# Patient Record
Sex: Female | Born: 1970 | Race: White | Hispanic: No | Marital: Married | Smoking: Never smoker
Health system: Southern US, Community
[De-identification: ages and names within clinical notes are randomized; demographics above are authoritative.]

## PROBLEM LIST (undated history)

## (undated) DIAGNOSIS — K219 Gastro-esophageal reflux disease without esophagitis: Secondary | ICD-10-CM

## (undated) DIAGNOSIS — M722 Plantar fascial fibromatosis: Secondary | ICD-10-CM

## (undated) HISTORY — DX: Plantar fascial fibromatosis: M72.2

## (undated) HISTORY — DX: Gastro-esophageal reflux disease without esophagitis: K21.9

---

## 2012-11-14 LAB — HM PAP SMEAR: HM Pap smear: NEGATIVE

## 2015-01-05 ENCOUNTER — Encounter: Payer: Self-pay | Admitting: *Deleted

## 2015-01-09 ENCOUNTER — Encounter: Payer: Self-pay | Admitting: Obstetrics and Gynecology

## 2015-01-27 ENCOUNTER — Ambulatory Visit
Admission: RE | Admit: 2015-01-27 | Discharge: 2015-01-27 | Disposition: A | Discharge status: Home / Self Care | Payer: BLUE CROSS/BLUE SHIELD | Source: Ambulatory Visit | Attending: Obstetrics and Gynecology | Admitting: Obstetrics and Gynecology

## 2015-01-27 ENCOUNTER — Other Ambulatory Visit: Payer: Self-pay | Admitting: Obstetrics and Gynecology

## 2015-01-27 ENCOUNTER — Ambulatory Visit (INDEPENDENT_AMBULATORY_CARE_PROVIDER_SITE_OTHER): Payer: BLUE CROSS/BLUE SHIELD | Admitting: Obstetrics and Gynecology

## 2015-01-27 ENCOUNTER — Encounter: Payer: Self-pay | Admitting: Obstetrics and Gynecology

## 2015-01-27 VITALS — BP 112/68 | HR 73 | Ht 62.0 in | Wt 156.7 lb

## 2015-01-27 DIAGNOSIS — Z01419 Encounter for gynecological examination (general) (routine) without abnormal findings: Secondary | ICD-10-CM

## 2015-01-27 DIAGNOSIS — Z1231 Encounter for screening mammogram for malignant neoplasm of breast: Secondary | ICD-10-CM | POA: Insufficient documentation

## 2015-01-27 NOTE — Progress Notes (Signed)
  Subjective:     Rebekah Rivera is a 44 y.o. female and is here for a comprehensive physical exam. The patient reports increased migraines with menses- relieved with excedrine Migraine.  Social History   Social History  . Marital Status: Married    Spouse Name: N/A  . Number of Children: N/A  . Years of Education: N/A   Occupational History  . Not on file.   Social History Main Topics  . Smoking status: Never Smoker   . Smokeless tobacco: Never Used  . Alcohol Use: No  . Drug Use: No  . Sexual Activity: Yes   Other Topics Concern  . Not on file   Social History Narrative   Health Maintenance  Topic Date Due  . HIV Screening  06/02/1983  . TETANUS/TDAP  06/02/1987  . INFLUENZA VACCINE  12/29/2014    The following portions of the patient's history were reviewed and updated as appropriate: allergies, current medications, past family history, past medical history, past social history, past surgical history and problem list.  Review of Systems A comprehensive review of systems was negative.   Objective:    General appearance: alert, cooperative and appears stated age Neck: no adenopathy, no carotid bruit, no JVD, supple, symmetrical, trachea midline and thyroid not enlarged, symmetric, no tenderness/mass/nodules Lungs: clear to auscultation bilaterally Breasts: normal appearance, no masses or tenderness Heart: regular rate and rhythm, S1, S2 normal, no murmur, click, rub or gallop Abdomen: soft, non-tender; bowel sounds normal; no masses,  no organomegaly Pelvic: cervix normal in appearance, external genitalia normal, no adnexal masses or tenderness, no cervical motion tenderness, rectovaginal septum normal, uterus normal size, shape, and consistency and vagina normal without discharge    Assessment:    Healthy female exam. Menstrual migraines; increased breast density; overweight      Plan:  Pap obtained, MMG ordered. RTC 1 year or prn   See After Visit Summary for  Counseling Recommendations

## 2015-01-27 NOTE — Patient Instructions (Signed)
  Place annual gynecologic exam patient instructions here.  Thank you for enrolling in MyChart. Please follow the instructions below to securely access your online medical record. MyChart allows you to send messages to your doctor, view your test results, manage appointments, and more.   How Do I Sign Up? 1. In your Internet browser, go to [employer] and enter [email or web address]. 2. Click on the Sign Up Now link in the Sign In box. You will see the New Member Sign Up page. 3. Enter your MyChart Access Code exactly as it appears below. You will not need to use this code after you've completed the sign-up process. If you do not sign up before the expiration date, you must request a new code.  MyChart Access Code: [email or web address] Expires: 03/28/2015 10:23 AM  4. Enter your Social Security Number ([id number]) and Date of Birth (mm/dd/yyyy) as indicated and click Submit. You will be taken to the next sign-up page. 5. Create a MyChart ID. This will be your MyChart login ID and cannot be changed, so think of one that is secure and easy to remember. 6. Create a MyChart password. You can change your password at any time. 7. Enter your Password Reset Question and Answer. This can be used at a later time if you forget your password.  8. Enter your e-mail address. You will receive e-mail notification when new information is available in MyChart. 9. Click Sign Up. You can now view your medical record.   Additional Information Remember, MyChart is NOT to be used for urgent needs. For medical emergencies, dial 911.

## 2015-01-28 LAB — CYTOLOGY - PAP

## 2015-01-29 ENCOUNTER — Telehealth: Payer: Self-pay | Admitting: *Deleted

## 2015-01-29 ENCOUNTER — Encounter: Payer: Self-pay | Admitting: *Deleted

## 2015-01-29 ENCOUNTER — Encounter: Payer: Self-pay | Admitting: Obstetrics and Gynecology

## 2015-01-29 NOTE — Telephone Encounter (Signed)
Mailed letter about [employer]	[place]

## 2015-12-15 ENCOUNTER — Other Ambulatory Visit: Payer: Self-pay | Admitting: Obstetrics and Gynecology

## 2015-12-15 DIAGNOSIS — Z1231 Encounter for screening mammogram for malignant neoplasm of breast: Secondary | ICD-10-CM

## 2016-01-28 ENCOUNTER — Ambulatory Visit
Admission: RE | Admit: 2016-01-28 | Discharge: 2016-01-28 | Disposition: A | Discharge status: Home / Self Care | Payer: BLUE CROSS/BLUE SHIELD | Source: Ambulatory Visit | Attending: Obstetrics and Gynecology | Admitting: Obstetrics and Gynecology

## 2016-01-28 ENCOUNTER — Other Ambulatory Visit: Payer: Self-pay | Admitting: Obstetrics and Gynecology

## 2016-01-28 DIAGNOSIS — Z1231 Encounter for screening mammogram for malignant neoplasm of breast: Secondary | ICD-10-CM

## 2016-02-24 ENCOUNTER — Ambulatory Visit (INDEPENDENT_AMBULATORY_CARE_PROVIDER_SITE_OTHER): Payer: BLUE CROSS/BLUE SHIELD | Admitting: Obstetrics and Gynecology

## 2016-02-24 ENCOUNTER — Encounter: Payer: Self-pay | Admitting: Obstetrics and Gynecology

## 2016-02-24 VITALS — BP 128/76 | HR 67 | Ht 62.0 in | Wt 155.3 lb

## 2016-02-24 DIAGNOSIS — F3281 Premenstrual dysphoric disorder: Secondary | ICD-10-CM

## 2016-02-24 DIAGNOSIS — Z01419 Encounter for gynecological examination (general) (routine) without abnormal findings: Secondary | ICD-10-CM | POA: Diagnosis not present

## 2016-02-24 MED ORDER — FLUOXETINE HCL 10 MG PO CAPS: 10.0000 mg | ORAL_CAPSULE | Freq: Every day | ORAL | 3 refills | Status: DC

## 2016-02-24 NOTE — Progress Notes (Signed)
Subjective:   Percell Belteresa Lederman is a 45 y.o. G0P0000 Caucasian female here for a routine well-woman exam.  Patient's last menstrual period was 02/07/2016.    Current complaints: worsening PMS PCP: none       doesn't desire labs  Social History: Sexual: heterosexual Marital Status: married Living situation: with family Occupation: homemaker Tobacco/alcohol: no tobacco use Illicit drugs: no history of illicit drug use  The following portions of the patient's history were reviewed and updated as appropriate: allergies, current medications, past family history, past medical history, past social history, past surgical history and problem list.  Past Medical History Past Medical History:  Diagnosis Date  . GERD (gastroesophageal reflux disease)     Past Surgical History History reviewed. No pertinent surgical history.  Gynecologic History G0P0000  Patient's last menstrual period was 02/07/2016. Contraception: tubal ligation Last Pap: 2016. Results were: normal Last mammogram: 2017. Results were: normal  Obstetric History OB History  Gravida Para Term Preterm AB Living  0 0 0 0 0 0  SAB TAB Ectopic Multiple Live Births  0 0 0 0 0        Current Medications No current outpatient prescriptions on file prior to visit.   No current facility-administered medications on file prior to visit.     Review of Systems Patient denies any headaches, blurred vision, shortness of breath, chest pain, abdominal pain, problems with bowel movements, urination, or intercourse.  Objective:  BP 128/76   Pulse 67   Ht 5\' 2"  (1.575 m)   Wt 155 lb 4.8 oz (70.4 kg)   LMP 02/07/2016   BMI 28.40 kg/m  Physical Exam  General:  Well developed, well nourished, no acute distress. She is alert and oriented x3. Skin:  Warm and dry Neck:  Midline trachea, no thyromegaly or nodules Cardiovascular: Regular rate and rhythm, no murmur heard Lungs:  Effort normal, all lung fields clear to auscultation  bilaterally Breasts:  No dominant palpable mass, retraction, or nipple discharge Abdomen:  Soft, non tender, no hepatosplenomegaly or masses Pelvic:  External genitalia is normal in appearance.  The vagina is normal in appearance. The cervix is bulbous, no CMT.  Thin prep pap is not done . Uterus is felt to be normal size, shape, and contour.  No adnexal masses or tenderness noted.  Extremities:  No swelling or varicosities noted Psych:  She has a normal mood and affect  Assessment:   Healthy well-woman exam PMDD  Plan:  Started prozac as needed for PMS F/U 1 year for AE, or sooner if needed   Jasilyn Holderman Suzan NailerN Elizabeth Paulsen, CNM

## 2016-02-24 NOTE — Patient Instructions (Signed)
Preventive Care for Adults, Female A healthy lifestyle and preventive care can promote health and wellness. Preventive health guidelines for women include the following key practices.  A routine yearly physical is a good way to check with your health care provider about your health and preventive screening. It is a chance to share any concerns and updates on your health and to receive a thorough exam.  Visit your dentist for a routine exam and preventive care every 6 months. Brush your teeth twice a day and floss once a day. Good oral hygiene prevents tooth decay and gum disease.  The frequency of eye exams is based on your age, health, family medical history, use of contact lenses, and other factors. Follow your health care provider's recommendations for frequency of eye exams.  Eat a healthy diet. Foods like vegetables, fruits, whole grains, low-fat dairy products, and lean protein foods contain the nutrients you need without too many calories. Decrease your intake of foods high in solid fats, added sugars, and salt. Eat the right amount of calories for you.Get information about a proper diet from your health care provider, if necessary.  Regular physical exercise is one of the most important things you can do for your health. Most adults should get at least 150 minutes of moderate-intensity exercise (any activity that increases your heart rate and causes you to sweat) each week. In addition, most adults need muscle-strengthening exercises on 2 or more days a week.  Maintain a healthy weight. The body mass index (BMI) is a screening tool to identify possible weight problems. It provides an estimate of body fat based on height and weight. Your health care provider can find your BMI and can help you achieve or maintain a healthy weight.For adults 20 years and older:  A BMI below 18.5 is considered underweight.  A BMI of 18.5 to 24.9 is normal.  A BMI of 25 to 29.9 is considered  overweight.  A BMI of 30 and above is considered obese.  Maintain normal blood lipids and cholesterol levels by exercising and minimizing your intake of saturated fat. Eat a balanced diet with plenty of fruit and vegetables. Blood tests for lipids and cholesterol should begin at age 64 and be repeated every 5 years. If your lipid or cholesterol levels are high, you are over 50, or you are at high risk for heart disease, you may need your cholesterol levels checked more frequently.Ongoing high lipid and cholesterol levels should be treated with medicines if diet and exercise are not working.  If you smoke, find out from your health care provider how to quit. If you do not use tobacco, do not start.  Lung cancer screening is recommended for adults aged 52-80 years who are at high risk for developing lung cancer because of a history of smoking. A yearly low-dose CT scan of the lungs is recommended for people who have at least a 30-pack-year history of smoking and are a current smoker or have quit within the past 15 years. A pack year of smoking is smoking an average of 1 pack of cigarettes a day for 1 year (for example: 1 pack a day for 30 years or 2 packs a day for 15 years). Yearly screening should continue until the smoker has stopped smoking for at least 15 years. Yearly screening should be stopped for people who develop a health problem that would prevent them from having lung cancer treatment.  If you are pregnant, do not drink alcohol. If you are  breastfeeding, be very cautious about drinking alcohol. If you are not pregnant and choose to drink alcohol, do not have more than 1 drink per day. One drink is considered to be 12 ounces (355 mL) of beer, 5 ounces (148 mL) of wine, or 1.5 ounces (44 mL) of liquor.  Avoid use of street drugs. Do not share needles with anyone. Ask for help if you need support or instructions about stopping the use of drugs.  High blood pressure causes heart disease and  increases the risk of stroke. Your blood pressure should be checked at least every 1 to 2 years. Ongoing high blood pressure should be treated with medicines if weight loss and exercise do not work.  If you are 25-78 years old, ask your health care provider if you should take aspirin to prevent strokes.  Diabetes screening is done by taking a blood sample to check your blood glucose level after you have not eaten for a certain period of time (fasting). If you are not overweight and you do not have risk factors for diabetes, you should be screened once every 3 years starting at age 86. If you are overweight or obese and you are 3-87 years of age, you should be screened for diabetes every year as part of your cardiovascular risk assessment.  Breast cancer screening is essential preventive care for women. You should practice "breast self-awareness." This means understanding the normal appearance and feel of your breasts and may include breast self-examination. Any changes detected, no matter how small, should be reported to a health care provider. Women in their 66s and 30s should have a clinical breast exam (CBE) by a health care provider as part of a regular health exam every 1 to 3 years. After age 43, women should have a CBE every year. Starting at age 37, women should consider having a mammogram (breast X-ray test) every year. Women who have a family history of breast cancer should talk to their health care provider about genetic screening. Women at a high risk of breast cancer should talk to their health care providers about having an MRI and a mammogram every year.  Breast cancer gene (BRCA)-related cancer risk assessment is recommended for women who have family members with BRCA-related cancers. BRCA-related cancers include breast, ovarian, tubal, and peritoneal cancers. Having family members with these cancers may be associated with an increased risk for harmful changes (mutations) in the breast  cancer genes BRCA1 and BRCA2. Results of the assessment will determine the need for genetic counseling and BRCA1 and BRCA2 testing.  Your health care provider may recommend that you be screened regularly for cancer of the pelvic organs (ovaries, uterus, and vagina). This screening involves a pelvic examination, including checking for microscopic changes to the surface of your cervix (Pap test). You may be encouraged to have this screening done every 3 years, beginning at age 78.  For women ages 79-65, health care providers may recommend pelvic exams and Pap testing every 3 years, or they may recommend the Pap and pelvic exam, combined with testing for human papilloma virus (HPV), every 5 years. Some types of HPV increase your risk of cervical cancer. Testing for HPV may also be done on women of any age with unclear Pap test results.  Other health care providers may not recommend any screening for nonpregnant women who are considered low risk for pelvic cancer and who do not have symptoms. Ask your health care provider if a screening pelvic exam is right for  you.  If you have had past treatment for cervical cancer or a condition that could lead to cancer, you need Pap tests and screening for cancer for at least 20 years after your treatment. If Pap tests have been discontinued, your risk factors (such as having a new sexual partner) need to be reassessed to determine if screening should resume. Some women have medical problems that increase the chance of getting cervical cancer. In these cases, your health care provider may recommend more frequent screening and Pap tests.  Colorectal cancer can be detected and often prevented. Most routine colorectal cancer screening begins at the age of 50 years and continues through age 75 years. However, your health care provider may recommend screening at an earlier age if you have risk factors for colon cancer. On a yearly basis, your health care provider may provide  home test kits to check for hidden blood in the stool. Use of a small camera at the end of a tube, to directly examine the colon (sigmoidoscopy or colonoscopy), can detect the earliest forms of colorectal cancer. Talk to your health care provider about this at age 50, when routine screening begins. Direct exam of the colon should be repeated every 5-10 years through age 75 years, unless early forms of precancerous polyps or small growths are found.  People who are at an increased risk for hepatitis B should be screened for this virus. You are considered at high risk for hepatitis B if:  You were born in a country where hepatitis B occurs often. Talk with your health care provider about which countries are considered high risk.  Your parents were born in a high-risk country and you have not received a shot to protect against hepatitis B (hepatitis B vaccine).  You have HIV or AIDS.  You use needles to inject street drugs.  You live with, or have sex with, someone who has hepatitis B.  You get hemodialysis treatment.  You take certain medicines for conditions like cancer, organ transplantation, and autoimmune conditions.  Hepatitis C blood testing is recommended for all people born from 1945 through 1965 and any individual with known risks for hepatitis C.  Practice safe sex. Use condoms and avoid high-risk sexual practices to reduce the spread of sexually transmitted infections (STIs). STIs include gonorrhea, chlamydia, syphilis, trichomonas, herpes, HPV, and human immunodeficiency virus (HIV). Herpes, HIV, and HPV are viral illnesses that have no cure. They can result in disability, cancer, and death.  You should be screened for sexually transmitted illnesses (STIs) including gonorrhea and chlamydia if:  You are sexually active and are younger than 24 years.  You are older than 24 years and your health care provider tells you that you are at risk for this type of infection.  Your sexual  activity has changed since you were last screened and you are at an increased risk for chlamydia or gonorrhea. Ask your health care provider if you are at risk.  If you are at risk of being infected with HIV, it is recommended that you take a prescription medicine daily to prevent HIV infection. This is called preexposure prophylaxis (PrEP). You are considered at risk if:  You are sexually active and do not regularly use condoms or know the HIV status of your partner(s).  You take drugs by injection.  You are sexually active with a partner who has HIV.  Talk with your health care provider about whether you are at high risk of being infected with HIV. If   you choose to begin PrEP, you should first be tested for HIV. You should then be tested every 3 months for as long as you are taking PrEP.  Osteoporosis is a disease in which the bones lose minerals and strength with aging. This can result in serious bone fractures or breaks. The risk of osteoporosis can be identified using a bone density scan. Women ages 1 years and over and women at risk for fractures or osteoporosis should discuss screening with their health care providers. Ask your health care provider whether you should take a calcium supplement or vitamin D to reduce the rate of osteoporosis.  Menopause can be associated with physical symptoms and risks. Hormone replacement therapy is available to decrease symptoms and risks. You should talk to your health care provider about whether hormone replacement therapy is right for you.  Use sunscreen. Apply sunscreen liberally and repeatedly throughout the day. You should seek shade when your shadow is shorter than you. Protect yourself by wearing long sleeves, pants, a wide-brimmed hat, and sunglasses year round, whenever you are outdoors.  Once a month, do a whole body skin exam, using a mirror to look at the skin on your back. Tell your health care provider of new moles, moles that have irregular  borders, moles that are larger than a pencil eraser, or moles that have changed in shape or color.  Stay current with required vaccines (immunizations).  Influenza vaccine. All adults should be immunized every year.  Tetanus, diphtheria, and acellular pertussis (Td, Tdap) vaccine. Pregnant women should receive 1 dose of Tdap vaccine during each pregnancy. The dose should be obtained regardless of the length of time since the last dose. Immunization is preferred during the 27th-36th week of gestation. An adult who has not previously received Tdap or who does not know her vaccine status should receive 1 dose of Tdap. This initial dose should be followed by tetanus and diphtheria toxoids (Td) booster doses every 10 years. Adults with an unknown or incomplete history of completing a 3-dose immunization series with Td-containing vaccines should begin or complete a primary immunization series including a Tdap dose. Adults should receive a Td booster every 10 years.  Varicella vaccine. An adult without evidence of immunity to varicella should receive 2 doses or a second dose if she has previously received 1 dose. Pregnant females who do not have evidence of immunity should receive the first dose after pregnancy. This first dose should be obtained before leaving the health care facility. The second dose should be obtained 4-8 weeks after the first dose.  Human papillomavirus (HPV) vaccine. Females aged 13-26 years who have not received the vaccine previously should obtain the 3-dose series. The vaccine is not recommended for use in pregnant females. However, pregnancy testing is not needed before receiving a dose. If a female is found to be pregnant after receiving a dose, no treatment is needed. In that case, the remaining doses should be delayed until after the pregnancy. Immunization is recommended for any person with an immunocompromised condition through the age of 24 years if she did not get any or all doses  earlier. During the 3-dose series, the second dose should be obtained 4-8 weeks after the first dose. The third dose should be obtained 24 weeks after the first dose and 16 weeks after the second dose.  Zoster vaccine. One dose is recommended for adults aged 97 years or older unless certain conditions are present.  Measles, mumps, and rubella (MMR) vaccine. Adults born  before 1957 generally are considered immune to measles and mumps. Adults born in [birth year] or later should have 1 or more doses of MMR vaccine unless there is a contraindication to the vaccine or there is laboratory evidence of immunity to each of the three diseases. A routine second dose of MMR vaccine should be obtained at least 28 days after the first dose for students attending postsecondary schools, health care workers, or international travelers. People who received inactivated measles vaccine or an unknown type of measles vaccine during 1963-1967 should receive 2 doses of MMR vaccine. People who received inactivated mumps vaccine or an unknown type of mumps vaccine before 1979 and are at high risk for mumps infection should consider immunization with 2 doses of MMR vaccine. For females of childbearing age, rubella immunity should be determined. If there is no evidence of immunity, females who are not pregnant should be vaccinated. If there is no evidence of immunity, females who are pregnant should delay immunization until after pregnancy. Unvaccinated health care workers born before 60 who lack laboratory evidence of measles, mumps, or rubella immunity or laboratory confirmation of disease should consider measles and mumps immunization with 2 doses of MMR vaccine or rubella immunization with 1 dose of MMR vaccine.  Pneumococcal 13-valent conjugate (PCV13) vaccine. When indicated, a person who is uncertain of his immunization history and has no record of immunization should receive the PCV13 vaccine. All adults 61 years of age and older  should receive this vaccine. An adult aged 92 years or older who has certain medical conditions and has not been previously immunized should receive 1 dose of PCV13 vaccine. This PCV13 should be followed with a dose of pneumococcal polysaccharide (PPSV23) vaccine. Adults who are at high risk for pneumococcal disease should obtain the PPSV23 vaccine at least 8 weeks after the dose of PCV13 vaccine. Adults older than 47 years of age who have normal immune system function should obtain the PPSV23 vaccine dose at least 1 year after the dose of PCV13 vaccine.  Pneumococcal polysaccharide (PPSV23) vaccine. When PCV13 is also indicated, PCV13 should be obtained first. All adults aged 2 years and older should be immunized. An adult younger than age 30 years who has certain medical conditions should be immunized. Any person who resides in a nursing home or long-term care facility should be immunized. An adult smoker should be immunized. People with an immunocompromised condition and certain other conditions should receive both PCV13 and PPSV23 vaccines. People with human immunodeficiency virus (HIV) infection should be immunized as soon as possible after diagnosis. Immunization during chemotherapy or radiation therapy should be avoided. Routine use of PPSV23 vaccine is not recommended for American Indians, [place] Natives, or people younger than 65 years unless there are medical conditions that require PPSV23 vaccine. When indicated, people who have unknown immunization and have no record of immunization should receive PPSV23 vaccine. One-time revaccination 5 years after the first dose of PPSV23 is recommended for people aged 19-64 years who have chronic kidney failure, nephrotic syndrome, asplenia, or immunocompromised conditions. People who received 1-2 doses of PPSV23 before age 44 years should receive another dose of PPSV23 vaccine at age 83 years or later if at least 5 years have passed since the previous dose. Doses  of PPSV23 are not needed for people immunized with PPSV23 at or after age 20 years.  Meningococcal vaccine. Adults with asplenia or persistent complement component deficiencies should receive 2 doses of quadrivalent meningococcal conjugate (MenACWY-D) vaccine. The doses should be obtained  at least 2 months apart. Microbiologists working with certain meningococcal bacteria, [place] recruits, people at risk during an outbreak, and people who travel to or live in countries with a high rate of meningitis should be immunized. A first-year college student up through age 28 years who is living in a residence hall should receive a dose if she did not receive a dose on or after her 16th birthday. Adults who have certain high-risk conditions should receive one or more doses of vaccine.  Hepatitis A vaccine. Adults who wish to be protected from this disease, have certain high-risk conditions, work with hepatitis A-infected animals, work in hepatitis A research labs, or travel to or work in countries with a high rate of hepatitis A should be immunized. Adults who were previously unvaccinated and who anticipate close contact with an international adoptee during the first 60 days after arrival in the [place] from a country with a high rate of hepatitis A should be immunized.  Hepatitis B vaccine. Adults who wish to be protected from this disease, have certain high-risk conditions, may be exposed to blood or other infectious body fluids, are household contacts or sex partners of hepatitis B positive people, are clients or workers in certain care facilities, or travel to or work in countries with a high rate of hepatitis B should be immunized.  Haemophilus influenzae type b (Hib) vaccine. A previously unvaccinated person with asplenia or sickle cell disease or having a scheduled splenectomy should receive 1 dose of Hib vaccine. Regardless of previous immunization, a recipient of a hematopoietic stem cell transplant  should receive a 3-dose series 6-12 months after her successful transplant. Hib vaccine is not recommended for adults with HIV infection. Preventive Services / Frequency Ages 71 to 87 years  Blood pressure check.** / Every 3-5 years.  Lipid and cholesterol check.** / Every 5 years beginning at age 1.  Clinical breast exam.** / Every 3 years for women in their 3s and 31s.  BRCA-related cancer risk assessment.** / For women who have family members with a BRCA-related cancer (breast, ovarian, tubal, or peritoneal cancers).  Pap test.** / Every 2 years from ages 50 through 86. Every 3 years starting at age 87 through age 7 or 75 with a history of 3 consecutive normal Pap tests.  HPV screening.** / Every 3 years from ages 59 through ages 35 to 6 with a history of 3 consecutive normal Pap tests.  Hepatitis C blood test.** / For any individual with known risks for hepatitis C.  Skin self-exam. / Monthly.  Influenza vaccine. / Every year.  Tetanus, diphtheria, and acellular pertussis (Tdap, Td) vaccine.** / Consult your health care provider. Pregnant women should receive 1 dose of Tdap vaccine during each pregnancy. 1 dose of Td every 10 years.  Varicella vaccine.** / Consult your health care provider. Pregnant females who do not have evidence of immunity should receive the first dose after pregnancy.  HPV vaccine. / 3 doses over 6 months, if 72 and younger. The vaccine is not recommended for use in pregnant females. However, pregnancy testing is not needed before receiving a dose.  Measles, mumps, rubella (MMR) vaccine.** / You need at least 1 dose of MMR if you were born in 1957 or later. You may also need a 2nd dose. For females of childbearing age, rubella immunity should be determined. If there is no evidence of immunity, females who are not pregnant should be vaccinated. If there is no evidence of immunity, females who are  pregnant should delay immunization until after  pregnancy.  Pneumococcal 13-valent conjugate (PCV13) vaccine.** / Consult your health care provider.  Pneumococcal polysaccharide (PPSV23) vaccine.** / 1 to 2 doses if you smoke cigarettes or if you have certain conditions.  Meningococcal vaccine.** / 1 dose if you are age 87 to 44 years and a Market researcher living in a residence hall, or have one of several medical conditions, you need to get vaccinated against meningococcal disease. You may also need additional booster doses.  Hepatitis A vaccine.** / Consult your health care provider.  Hepatitis B vaccine.** / Consult your health care provider.  Haemophilus influenzae type b (Hib) vaccine.** / Consult your health care provider. Ages 86 to 38 years  Blood pressure check.** / Every year.  Lipid and cholesterol check.** / Every 5 years beginning at age 49 years.  Lung cancer screening. / Every year if you are aged 71-80 years and have a 30-pack-year history of smoking and currently smoke or have quit within the past 15 years. Yearly screening is stopped once you have quit smoking for at least 15 years or develop a health problem that would prevent you from having lung cancer treatment.  Clinical breast exam.** / Every year after age 51 years.  BRCA-related cancer risk assessment.** / For women who have family members with a BRCA-related cancer (breast, ovarian, tubal, or peritoneal cancers).  Mammogram.** / Every year beginning at age 18 years and continuing for as long as you are in good health. Consult with your health care provider.  Pap test.** / Every 3 years starting at age 63 years through age 37 or 57 years with a history of 3 consecutive normal Pap tests.  HPV screening.** / Every 3 years from ages 41 years through ages 76 to 23 years with a history of 3 consecutive normal Pap tests.  Fecal occult blood test (FOBT) of stool. / Every year beginning at age 36 years and continuing until age 51 years. You may not need  to do this test if you get a colonoscopy every 10 years.  Flexible sigmoidoscopy or colonoscopy.** / Every 5 years for a flexible sigmoidoscopy or every 10 years for a colonoscopy beginning at age 36 years and continuing until age 35 years.  Hepatitis C blood test.** / For all people born from [birth year] through 1965 and any individual with known risks for hepatitis C.  Skin self-exam. / Monthly.  Influenza vaccine. / Every year.  Tetanus, diphtheria, and acellular pertussis (Tdap/Td) vaccine.** / Consult your health care provider. Pregnant women should receive 1 dose of Tdap vaccine during each pregnancy. 1 dose of Td every 10 years.  Varicella vaccine.** / Consult your health care provider. Pregnant females who do not have evidence of immunity should receive the first dose after pregnancy.  Zoster vaccine.** / 1 dose for adults aged 73 years or older.  Measles, mumps, rubella (MMR) vaccine.** / You need at least 1 dose of MMR if you were born in 1957 or later. You may also need a second dose. For females of childbearing age, rubella immunity should be determined. If there is no evidence of immunity, females who are not pregnant should be vaccinated. If there is no evidence of immunity, females who are pregnant should delay immunization until after pregnancy.  Pneumococcal 13-valent conjugate (PCV13) vaccine.** / Consult your health care provider.  Pneumococcal polysaccharide (PPSV23) vaccine.** / 1 to 2 doses if you smoke cigarettes or if you have certain conditions.  Meningococcal vaccine.** /  Consult your health care provider.  Hepatitis A vaccine.** / Consult your health care provider.  Hepatitis B vaccine.** / Consult your health care provider.  Haemophilus influenzae type b (Hib) vaccine.** / Consult your health care provider. Ages 80 years and over  Blood pressure check.** / Every year.  Lipid and cholesterol check.** / Every 5 years beginning at age 62 years.  Lung cancer  screening. / Every year if you are aged 32-80 years and have a 30-pack-year history of smoking and currently smoke or have quit within the past 15 years. Yearly screening is stopped once you have quit smoking for at least 15 years or develop a health problem that would prevent you from having lung cancer treatment.  Clinical breast exam.** / Every year after age 61 years.  BRCA-related cancer risk assessment.** / For women who have family members with a BRCA-related cancer (breast, ovarian, tubal, or peritoneal cancers).  Mammogram.** / Every year beginning at age 39 years and continuing for as long as you are in good health. Consult with your health care provider.  Pap test.** / Every 3 years starting at age 85 years through age 74 or 72 years with 3 consecutive normal Pap tests. Testing can be stopped between 65 and 70 years with 3 consecutive normal Pap tests and no abnormal Pap or HPV tests in the past 10 years.  HPV screening.** / Every 3 years from ages 55 years through ages 67 or 77 years with a history of 3 consecutive normal Pap tests. Testing can be stopped between 65 and 70 years with 3 consecutive normal Pap tests and no abnormal Pap or HPV tests in the past 10 years.  Fecal occult blood test (FOBT) of stool. / Every year beginning at age 81 years and continuing until age 22 years. You may not need to do this test if you get a colonoscopy every 10 years.  Flexible sigmoidoscopy or colonoscopy.** / Every 5 years for a flexible sigmoidoscopy or every 10 years for a colonoscopy beginning at age 67 years and continuing until age 22 years.  Hepatitis C blood test.** / For all people born from [birth year] through 1967 and any individual with known risks for hepatitis C.  Osteoporosis screening.** / A one-time screening for women ages 8 years and over and women at risk for fractures or osteoporosis.  Skin self-exam. / Monthly.  Influenza vaccine. / Every year.  Tetanus, diphtheria, and  acellular pertussis (Tdap/Td) vaccine.** / 1 dose of Td every 10 years.  Varicella vaccine.** / Consult your health care provider.  Zoster vaccine.** / 1 dose for adults aged 56 years or older.  Pneumococcal 13-valent conjugate (PCV13) vaccine.** / Consult your health care provider.  Pneumococcal polysaccharide (PPSV23) vaccine.** / 1 dose for all adults aged 15 years and older.  Meningococcal vaccine.** / Consult your health care provider.  Hepatitis A vaccine.** / Consult your health care provider.  Hepatitis B vaccine.** / Consult your health care provider.  Haemophilus influenzae type b (Hib) vaccine.** / Consult your health care provider. ** Family history and personal history of risk and conditions may change your health care provider's recommendations.   This information is not intended to replace advice given to you by your health care provider. Make sure you discuss any questions you have with your health care provider.   Document Released: 07/12/2001 Document Revised: 06/06/2014 Document Reviewed: 10/11/2010 Elsevier Interactive Patient Education [employer].

## 2017-02-23 ENCOUNTER — Ambulatory Visit: Payer: BLUE CROSS/BLUE SHIELD

## 2017-02-23 DIAGNOSIS — M722 Plantar fascial fibromatosis: Secondary | ICD-10-CM

## 2017-02-24 ENCOUNTER — Ambulatory Visit (INDEPENDENT_AMBULATORY_CARE_PROVIDER_SITE_OTHER): Payer: BLUE CROSS/BLUE SHIELD

## 2017-02-24 ENCOUNTER — Ambulatory Visit (INDEPENDENT_AMBULATORY_CARE_PROVIDER_SITE_OTHER): Payer: BLUE CROSS/BLUE SHIELD | Admitting: Podiatry

## 2017-02-24 ENCOUNTER — Encounter: Payer: Self-pay | Admitting: Podiatry

## 2017-02-24 VITALS — BP 104/62 | HR 62

## 2017-02-24 DIAGNOSIS — M722 Plantar fascial fibromatosis: Secondary | ICD-10-CM

## 2017-02-24 MED ORDER — DICLOFENAC SODIUM 75 MG PO TBEC
75.0000 mg | DELAYED_RELEASE_TABLET | Freq: Two times a day (BID) | ORAL | 0 refills | Status: DC
Start: 2017-02-24 — End: 2017-03-24

## 2017-02-24 MED ORDER — BETAMETHASONE SOD PHOS & ACET 6 (3-3) MG/ML IJ SUSP: 3.0000 mg | Freq: Once | INTRAMUSCULAR | Status: AC

## 2017-02-24 NOTE — Progress Notes (Signed)
   Subjective: Patient presents today for pain and tenderness in the right foot. Patient states the foot pain has been hurting for a couple of years now. Patient states that it hurts in the mornings with the first steps out of bed. Patient denies any significant trauma. She has tried over-the-counter orthotics, stretching, icing. Patient presents today for further treatment and evaluation.  Past Medical History:  Diagnosis Date  . GERD (gastroesophageal reflux disease)      Objective: Physical Exam General: The patient is alert and oriented x3 in no acute distress.  Dermatology: Skin is warm, dry and supple bilateral lower extremities. Negative for open lesions or macerations bilateral.   Vascular: Dorsalis Pedis and Posterior Tibial pulses palpable bilateral.  Capillary fill time is immediate to all digits.  Neurological: Epicritic and protective threshold intact bilateral.   Musculoskeletal: Tenderness to palpation at the medial calcaneal tubercale and through the insertion of the plantar fascia of the right foot. All other joints range of motion within normal limits bilateral. Strength 5/5 in all groups bilateral.   Radiographic exam: Normal osseous mineralization. Joint spaces preserved. No fracture/dislocation/boney destruction. Calcaneal spur present with mild thickening of plantar fascia right. No other soft tissue abnormalities or radiopaque foreign bodies.   Assessment: 1. Plantar fasciitis right 2. Pain in right foot  Plan of Care:  1. Patient evaluated. Xrays reviewed.   2. Injection of 0.5cc Celestone soluspan injected into the right plantar fascia  3. Rx for Diclofenac  PO BID ordered for patient. 4. Plantar fascial band(s) dispensed 5. Instructed patient regarding therapies and modalities at home to alleviate symptoms.  6. Return to clinic in 4 weeks.     Felecia Shelling, DPM [facility]  Dr. Felecia Shelling, DPM    [address] [place], [place] [address]                Office [phone]  Fax [phone]

## 2017-03-24 ENCOUNTER — Other Ambulatory Visit: Payer: Self-pay

## 2017-03-24 ENCOUNTER — Encounter: Payer: Self-pay | Admitting: Podiatry

## 2017-03-24 ENCOUNTER — Ambulatory Visit (INDEPENDENT_AMBULATORY_CARE_PROVIDER_SITE_OTHER): Payer: BLUE CROSS/BLUE SHIELD | Admitting: Podiatry

## 2017-03-24 DIAGNOSIS — M722 Plantar fascial fibromatosis: Secondary | ICD-10-CM | POA: Diagnosis not present

## 2017-03-24 MED ORDER — DICLOFENAC SODIUM 75 MG PO TBEC: 75.0000 mg | DELAYED_RELEASE_TABLET | Freq: Two times a day (BID) | ORAL | 1 refills | Status: DC

## 2017-03-26 NOTE — Progress Notes (Signed)
   Subjective: Patient presents today for follow-up evaluation of right plantar fasciitis. She states the pain has improved but she is still experiencing some soreness of the foot. She reports associated tightness to the posterior heel. She reports some relief after the injection but it only lasted for a few days. Patient presents today for further treatment and evaluation.   Past Medical History:  Diagnosis Date  . GERD (gastroesophageal reflux disease)      Objective: Physical Exam General: The patient is alert and oriented x3 in no acute distress.  Dermatology: Skin is warm, dry and supple bilateral lower extremities. Negative for open lesions or macerations bilateral.   Vascular: Dorsalis Pedis and Posterior Tibial pulses palpable bilateral.  Capillary fill time is immediate to all digits.  Neurological: Epicritic and protective threshold intact bilateral.   Musculoskeletal: Tenderness to palpation at the medial calcaneal tubercale and through the insertion of the plantar fascia of the right foot. All other joints range of motion within normal limits bilateral. Strength 5/5 in all groups bilateral.    Assessment: 1. Plantar fasciitis right 2. Pain in right foot  Plan of Care:  1. Patient evaluated.  2. Injection of 0.5cc Celestone soluspan injected into the right plantar fascia  3. Refill prescription for diclofenac given to patient. 4. Continue wearing plantar fascial brace. 5. Return to clinic in 4 weeks. If not better, appt with Raiford Nobleick for custom molded orthotics.   Felecia ShellingBrent M. Xaviera Flaten, DPM [facility]  Dr. Felecia ShellingBrent M. Rebeca Valdivia, DPM    [address]                                        [place], [place] [address]                Office [phone]  Fax [phone]

## 2017-03-29 MED ORDER — BETAMETHASONE SOD PHOS & ACET 6 (3-3) MG/ML IJ SUSP: 3.0000 mg | Freq: Once | INTRAMUSCULAR | Status: AC

## 2017-04-04 ENCOUNTER — Other Ambulatory Visit: Payer: Self-pay | Admitting: Obstetrics and Gynecology

## 2017-04-04 DIAGNOSIS — Z1231 Encounter for screening mammogram for malignant neoplasm of breast: Secondary | ICD-10-CM

## 2017-04-05 ENCOUNTER — Ambulatory Visit (INDEPENDENT_AMBULATORY_CARE_PROVIDER_SITE_OTHER): Payer: BLUE CROSS/BLUE SHIELD | Admitting: Obstetrics and Gynecology

## 2017-04-05 ENCOUNTER — Encounter: Payer: Self-pay | Admitting: Obstetrics and Gynecology

## 2017-04-05 VITALS — BP 122/70 | HR 56 | Ht 62.0 in | Wt 159.9 lb

## 2017-04-05 DIAGNOSIS — Z01419 Encounter for gynecological examination (general) (routine) without abnormal findings: Secondary | ICD-10-CM | POA: Diagnosis not present

## 2017-04-05 DIAGNOSIS — Z23 Encounter for immunization: Secondary | ICD-10-CM

## 2017-04-05 DIAGNOSIS — Z8262 Family history of osteoporosis: Secondary | ICD-10-CM | POA: Diagnosis not present

## 2017-04-05 NOTE — Progress Notes (Signed)
Subjective:   Percell Belteresa Mcglinchey is a 46 y.o. G0P0000 Caucasian female here for a routine well-woman exam.  Patient's last menstrual period was 03/30/2017.    Current complaints: none PCP: ?       does desire labs  Social History: Sexual: heterosexual Marital Status: married Living situation: with spouse Occupation: homemaker Tobacco/alcohol: no tobacco use Illicit drugs: no history of illicit drug use  The following portions of the patient's history were reviewed and updated as appropriate: allergies, current medications, past family history, past medical history, past social history, past surgical history and problem list.  Past Medical History Past Medical History:  Diagnosis Date  . GERD (gastroesophageal reflux disease)     Past Surgical History No past surgical history on file.  Gynecologic History G0P0000  Patient's last menstrual period was 03/30/2017. Contraception: tubal ligation Last Pap: 2016. Results were: normal Last mammogram: 2017. Results were: normal   Obstetric History OB History  Gravida Para Term Preterm AB Living  0 0 0 0 0 0  SAB TAB Ectopic Multiple Live Births  0 0 0 0 0        Current Medications Current Outpatient Medications on File Prior to Visit  Medication Sig Dispense Refill  . diclofenac (VOLTAREN) 75 MG EC tablet Take 1 tablet (75 mg total) by mouth 2 (two) times daily. (Patient not taking: Reported on 04/05/2017) 60 tablet 1   Current Facility-Administered Medications on File Prior to Visit  Medication Dose Route Frequency Provider Last Rate Last Dose  . betamethasone acetate-betamethasone sodium phosphate (CELESTONE) injection 3 mg  3 mg Intramuscular Once Gala LewandowskyEvans, Brent M, DPM      . betamethasone acetate-betamethasone sodium phosphate (CELESTONE) injection 3 mg  3 mg Intramuscular Once Felecia ShellingEvans, Brent M, DPM        Review of Systems Patient denies any headaches, blurred vision, shortness of breath, chest pain, abdominal pain, problems  with bowel movements, urination, or intercourse.  Objective:  BP 122/70   Pulse (!) 56   Ht 5\' 2"  (1.575 m)   Wt 159 lb 14.4 oz (72.5 kg)   LMP 03/30/2017   BMI 29.25 kg/m  Physical Exam  General:  Well developed, well nourished, no acute distress. She is alert and oriented x3. Skin:  Warm and dry Neck:  Midline trachea, no thyromegaly or nodules Cardiovascular: Regular rate and rhythm, no murmur heard Lungs:  Effort normal, all lung fields clear to auscultation bilaterally Breasts:  No dominant palpable mass, retraction, or nipple discharge Abdomen:  Soft, non tender, no hepatosplenomegaly or masses Pelvic:  External genitalia is normal in appearance.  The vagina is normal in appearance. The cervix is bulbous, no CMT.  Thin prep pap is not done . Uterus is felt to be normal size, shape, and contour.  No adnexal masses or tenderness noted. Extremities:  No swelling or varicosities noted Psych:  She has a normal mood and affect  Assessment:   Healthy well-woman exam Needs flu vaccine Family history of osteoporosis  Plan:  Flu vaccine given Labs obtained- will follow up accordingly F/U 1 year for AE, or sooner if needed Mammogram ordered  Idora Brosious Suzan NailerN Wallace Gappa, CNM

## 2017-04-06 LAB — COMPREHENSIVE METABOLIC PANEL
A/G RATIO: 2 (ref 1.2–2.2)
ALT: 14 IU/L (ref 0–32)
AST: 15 IU/L (ref 0–40)
Albumin: 4.4 g/dL (ref 3.5–5.5)
Alkaline Phosphatase: 87 IU/L (ref 39–117)
BUN/Creatinine Ratio: 11 (ref 9–23)
BUN: 8 mg/dL (ref 6–24)
Bilirubin Total: 0.3 mg/dL (ref 0.0–1.2)
CALCIUM: 9.4 mg/dL (ref 8.7–10.2)
CO2: 26 mmol/L (ref 20–29)
CREATININE: 0.74 mg/dL (ref 0.57–1.00)
Chloride: 106 mmol/L (ref 96–106)
GFR calc Af Amer: 111 mL/min/{1.73_m2} (ref >59)
GFR, EST NON AFRICAN AMERICAN: 96 mL/min/{1.73_m2} (ref >59)
Globulin, Total: 2.2 g/dL (ref 1.5–4.5)
Glucose: 90 mg/dL (ref 65–99)
POTASSIUM: 4.8 mmol/L (ref 3.5–5.2)
Sodium: 142 mmol/L (ref 134–144)
TOTAL PROTEIN: 6.6 g/dL (ref 6.0–8.5)

## 2017-04-06 LAB — VITAMIN D 25 HYDROXY (VIT D DEFICIENCY, FRACTURES): VIT D 25 HYDROXY: 36.9 ng/mL (ref 30.0–100.0)

## 2017-04-07 ENCOUNTER — Ambulatory Visit
Admission: RE | Admit: 2017-04-07 | Discharge: 2017-04-07 | Disposition: A | Discharge status: Home / Self Care | Payer: BLUE CROSS/BLUE SHIELD | Source: Ambulatory Visit | Attending: Obstetrics and Gynecology | Admitting: Obstetrics and Gynecology

## 2017-04-07 DIAGNOSIS — Z1231 Encounter for screening mammogram for malignant neoplasm of breast: Secondary | ICD-10-CM | POA: Diagnosis not present

## 2017-04-10 ENCOUNTER — Other Ambulatory Visit: Payer: Self-pay | Admitting: Obstetrics and Gynecology

## 2017-04-10 DIAGNOSIS — N6310 Unspecified lump in the right breast, unspecified quadrant: Secondary | ICD-10-CM

## 2017-04-10 DIAGNOSIS — R928 Other abnormal and inconclusive findings on diagnostic imaging of breast: Secondary | ICD-10-CM

## 2017-04-25 ENCOUNTER — Ambulatory Visit (INDEPENDENT_AMBULATORY_CARE_PROVIDER_SITE_OTHER): Payer: BLUE CROSS/BLUE SHIELD | Admitting: Podiatry

## 2017-04-25 ENCOUNTER — Encounter: Payer: Self-pay | Admitting: Podiatry

## 2017-04-25 DIAGNOSIS — M722 Plantar fascial fibromatosis: Secondary | ICD-10-CM

## 2017-04-26 ENCOUNTER — Ambulatory Visit
Admission: RE | Admit: 2017-04-26 | Discharge: 2017-04-26 | Disposition: A | Discharge status: Home / Self Care | Payer: BLUE CROSS/BLUE SHIELD | Source: Ambulatory Visit | Attending: Obstetrics and Gynecology | Admitting: Obstetrics and Gynecology

## 2017-04-26 DIAGNOSIS — R928 Other abnormal and inconclusive findings on diagnostic imaging of breast: Secondary | ICD-10-CM

## 2017-04-26 DIAGNOSIS — N6313 Unspecified lump in the right breast, lower outer quadrant: Secondary | ICD-10-CM | POA: Insufficient documentation

## 2017-04-26 DIAGNOSIS — N6310 Unspecified lump in the right breast, unspecified quadrant: Secondary | ICD-10-CM

## 2017-04-26 DIAGNOSIS — N6311 Unspecified lump in the right breast, upper outer quadrant: Secondary | ICD-10-CM | POA: Insufficient documentation

## 2017-04-26 DIAGNOSIS — N6001 Solitary cyst of right breast: Secondary | ICD-10-CM | POA: Insufficient documentation

## 2017-04-27 NOTE — Progress Notes (Signed)
   Subjective: Patient presents today for follow-up evaluation of right plantar fasciitis. She states the pain has improved but is still presents. She currently rates it at 3-4/10. She has been wearing the fascial brace and taking Diclofenac as directed. There are no modifying factors noted. Patient presents today for further treatment and evaluation.   Past Medical History:  Diagnosis Date  . GERD (gastroesophageal reflux disease)      Objective: Physical Exam General: The patient is alert and oriented x3 in no acute distress.  Dermatology: Skin is warm, dry and supple bilateral lower extremities. Negative for open lesions or macerations bilateral.   Vascular: Dorsalis Pedis and Posterior Tibial pulses palpable bilateral.  Capillary fill time is immediate to all digits.  Neurological: Epicritic and protective threshold intact bilateral.   Musculoskeletal: Tenderness to palpation at the medial calcaneal tubercale and through the insertion of the plantar fascia of the right foot. All other joints range of motion within normal limits bilateral. Strength 5/5 in all groups bilateral.    Assessment: 1. Plantar fasciitis  - improving  Plan of Care:  1. Patient evaluated.  2. Injection of 0.5cc Celestone soluspan injected into the right plantar fascia  3. Continue taking Diclofenac and wearing fascial brace. 4. Stressed the importance of stretching. 5. Return to clinic in 4 weeks.  Wants to get back to exercising on the Wii Board.    Felecia ShellingBrent M. Akela Pocius, DPM [facility]  Dr. Felecia ShellingBrent M. Danh Bayus, DPM    [address]                                        [place], [place] [address]                Office [phone]  Fax [phone]

## 2017-06-02 ENCOUNTER — Other Ambulatory Visit: Payer: Self-pay

## 2017-06-02 ENCOUNTER — Ambulatory Visit (INDEPENDENT_AMBULATORY_CARE_PROVIDER_SITE_OTHER): Payer: BLUE CROSS/BLUE SHIELD | Admitting: Nurse Practitioner

## 2017-06-02 ENCOUNTER — Encounter: Payer: Self-pay | Admitting: Nurse Practitioner

## 2017-06-02 VITALS — BP 129/64 | HR 63 | Temp 98.5°F | Ht 62.0 in | Wt 162.4 lb

## 2017-06-02 DIAGNOSIS — Z7689 Persons encountering health services in other specified circumstances: Secondary | ICD-10-CM | POA: Diagnosis not present

## 2017-06-02 DIAGNOSIS — M5442 Lumbago with sciatica, left side: Secondary | ICD-10-CM | POA: Diagnosis not present

## 2017-06-02 MED ORDER — PREDNISONE 20 MG PO TABS: ORAL_TABLET | ORAL | 0 refills | Status: DC

## 2017-06-02 MED ORDER — BACLOFEN 10 MG PO TABS: 10.0000 mg | ORAL_TABLET | Freq: Three times a day (TID) | ORAL | 0 refills | Status: DC

## 2017-06-02 NOTE — Patient Instructions (Addendum)
Rebekah Rivera, Thank you for coming in to clinic today.  1. For your left leg pain: - START 10 day prednisone taper.  Take prednisone 20 mg tablets  - Day 1-4 take 3 pills (60 mg) once daily.  - Day 5-6 take 2 pills (40 mg) once daily.  - Day 7-8 take 1 pill (20 mg) once daily.  - Day 9-10 take 1/2 pill (10 mg) once daily.  2. START baclofen 10 mg three times daily as needed for muscle spasm and leg pain. - Can make you very drowsy.  Use only when you will be home or only at bedtime.  3. START physical therapy.  They will call you to set up your appointments. - FOR now, start some low back pain exercises.  Please schedule a follow-up appointment with Wilhelmina Mcardle, AGNP. Return 4-6 weeks if symptoms worsen or fail to improve.  If you have any other questions or concerns, please feel free to call the clinic or send a message through MyChart. You may also schedule an earlier appointment if necessary.  You will receive a survey after today's visit either digitally by e-mail or paper by [employer]. Your experiences and feedback matter to [place].  Please respond so we know how we are doing as we provide care for you.  Wilhelmina Mcardle, DNP, AGNP-BC Adult Gerontology Nurse Practitioner [facility], [facility]    Low Back Pain Exercises See other page with pictures of each exercise.  Start with 1 or 2 of these exercises that you are most comfortable with. Do not do any exercises that cause you significant worsening pain. Some of these may cause some "stretching soreness" but it should go away after you stop the exercise, and get better over time. Gradually increase up to 3-4 exercises as tolerated.  Standing hamstring stretch: Place the heel of your leg on a stool about 15 inches high. Keep your knee straight. Lean forward, bending at the hips until you feel a mild stretch in the back of your thigh. Make sure you do not roll your shoulders and bend at the waist when doing this or you will  stretch your lower back instead. Hold the stretch for 15 to 30 seconds. Repeat 3 times. Repeat the same stretch on your other leg.  Cat and camel: Get down on your hands and knees. Let your stomach sag, allowing your back to curve downward. Hold this position for 5 seconds. Then arch your back and hold for 5 seconds. Do 3 sets of 10.  Quadriped Arm/Leg Raises: Get down on your hands and knees. Tighten your abdominal muscles to stiffen your spine. While keeping your abdominals tight, raise one arm and the opposite leg away from you. Hold this position for 5 seconds. Lower your arm and leg slowly and alternate sides. Do this 10 times on each side.  Pelvic tilt: Lie on your back with your knees bent and your feet flat on the floor. Tighten your abdominal muscles and push your lower back into the floor. Hold this position for 5 seconds, then relax. Do 3 sets of 10.  Partial curl: Lie on your back with your knees bent and your feet flat on the floor. Tighten your stomach muscles and flatten your back against the floor. Tuck your chin to your chest. With your hands stretched out in front of you, curl your upper body forward until your shoulders clear the floor. Hold this position for 3 seconds. Don't hold your breath. It helps to  breathe out as you lift your shoulders up. Relax. Repeat 10 times. Build to 3 sets of 10. To challenge yourself, clasp your hands behind your head and keep your elbows out to the side.  Lower trunk rotation: Lie on your back with your knees bent and your feet flat on the floor. Tighten your abdominal muscles and push your lower back into the floor. Keeping your shoulders down flat, gently rotate your legs to one side, then the other as far as you can. Repeat 10 to 20 times.  Single knee to chest stretch: Lie on your back with your legs straight out in front of you. Bring one knee up to your chest and grasp the back of your thigh. Pull your knee toward your chest, stretching your  buttock muscle. Hold this position for 15 to 30 seconds and return to the starting position. Repeat 3 times on each side.  Double knee to chest: Lie on your back with your knees bent and your feet flat on the floor. Tighten your abdominal muscles and push your lower back into the floor. Pull both knees up to your chest. Hold for 5 seconds and repeat 10 to 20 times.

## 2017-06-02 NOTE — Progress Notes (Signed)
Subjective:    Patient ID: Rebekah Rivera, female    DOB: 10/23/1970, 47 y.o.   MRN: [id number]  Rebekah Rivera is a 47 y.o. female presenting on 06/02/2017 for Establish Care (burning pain that radiates down the buttucks and the back of the leg  x 2 weeks )   HPI  Establish Care New Provider Pt last seen by PCP many years ago, but has been most recently followed by Rebekah Rivera, CNM at [facility] for wellness exams.  Results will be reviewed in [facility].   L Back and Leg Pain Started around Thanksgiving.  Sitting in car, toes would become numb, but overnight would have cramps in leg and now "shockwave" through her leg.  Did have shooting pain and in last 2 weeks is now constant.  More tolerable as day progresses.  Sleeps for a few hours before being awoken by pain in leg. - No prior back pain or injury known.  Raked leaves and was sore, but no other injury.  - Took diclofenac since March 24, 2017. She has been out of these for 4 days and being off diclofenac has not worsened her leg pain.  Past Medical History:  Diagnosis Date  . GERD (gastroesophageal reflux disease)   . Plantar fasciitis    No past surgical history on file. Social History   Socioeconomic History  . Marital status: Married    Spouse name: Not on file  . Number of children: Not on file  . Years of education: associates  . Highest education level: Associate degree: occupational, Scientist, product/process developmenttechnical, or vocational program  Social Needs  . Financial resource strain: Not hard at all  . Food insecurity - worry: Not on file  . Food insecurity - inability: Not on file  . Transportation needs - medical: Not on file  . Transportation needs - non-medical: Not on file  Occupational History  . Occupation: homemaker  Tobacco Use  . Smoking status: Never Smoker  . Smokeless tobacco: Never Used  Substance and Sexual Activity  . Alcohol use: No  . Drug use: No  . Sexual activity: Yes  Other Topics Concern  . Not on file    Social History Narrative  . Not on file   Family History  Problem Relation Age of Onset  . Osteoporosis Mother   . Cancer Father        prostate, skin  . Osteoporosis Father   . Prostate cancer Father   . Lung cancer Other   . Breast cancer Neg Hx    Current Outpatient Medications on File Prior to Visit  Medication Sig  . Ascorbic Acid (VITAMIN C) 1000 MG tablet Take 1,000 mg by mouth daily.  . calcium carbonate (TUMS - DOSED IN MG ELEMENTAL CALCIUM) 500 MG chewable tablet Chew 1 tablet by mouth daily.  . Multiple Vitamin (MULTIVITAMIN) tablet Take 1 tablet by mouth daily.  Marland Kitchen. omeprazole (PRILOSEC) 20 MG capsule Take 20 mg by mouth daily. For two weeks as needed for heartburn.  . diclofenac (VOLTAREN) 75 MG EC tablet Take 1 tablet (75 mg total) by mouth 2 (two) times daily. (Patient not taking: Reported on 04/05/2017)   Current Facility-Administered Medications on File Prior to Visit  Medication  . betamethasone acetate-betamethasone sodium phosphate (CELESTONE) injection 3 mg  . betamethasone acetate-betamethasone sodium phosphate (CELESTONE) injection 3 mg    Review of Systems  Constitutional: Negative.   HENT: Negative.   Eyes: Negative.   Respiratory: Negative.   Cardiovascular: Negative.  Gastrointestinal: Negative.   Endocrine: Negative.   Genitourinary: Negative.   Musculoskeletal: Positive for arthralgias, back pain and myalgias.  Skin: Negative.   Allergic/Immunologic: Negative.   Neurological: Negative.   Hematological: Negative.   Psychiatric/Behavioral: Negative.    Per HPI unless specifically indicated above     Objective:    BP 129/64 (BP Location: Right Arm, Patient Position: Sitting, Cuff Size: Normal)   Pulse 63   Temp 98.5 F (36.9 C) (Oral)   Ht 5\' 2"  (1.575 m)   Wt 162 lb 6.4 oz (73.7 kg)   LMP 05/28/2017   BMI 29.70 kg/m   Wt Readings from Last 3 Encounters:  06/02/17 162 lb 6.4 oz (73.7 kg)  04/05/17 159 lb 14.4 oz (72.5 kg)  02/24/16  155 lb 4.8 oz (70.4 kg)    Physical Exam  General - overweight, well-appearing, NAD HEENT - Normocephalic, atraumatic Neck - supple, non-tender, no LAD, no thyromegaly Heart - RRR, no murmurs heard Lungs - Clear throughout all lobes, no wheezing, crackles, or rhonchi. Normal work of breathing. Musculoskeletal - Low Back Inspection: Normal appearance, no spinal deformity, symmetrical. Palpation: No tenderness over spinous processes. Bilateral lumbar paraspinal muscles tender and with hypertonicity/spasm. ROM: Decreased active ROM forward flex / back extension, rotation L/R without discomfort Special Testing: Seated SLR positive on left for radicular pain. Strength: Bilateral hip flex/ext 5/5, knee flex/ext 5/5, ankle dorsiflex/plantarflex 5/5 Neurovascular: intact distal sensation to light touch Extremeties - non-tender, no edema, cap refill < 2 seconds, peripheral pulses intact +2 bilaterally Skin - warm, dry Neuro - awake, alert, oriented x3, normal gait Psych - Normal mood and affect, normal behavior    Results for orders placed or performed in visit on 04/05/17  Vitamin D (25 hydroxy)  Result Value Ref Range   Vit D, 25-Hydroxy 36.9 30.0 - 100.0 ng/mL  Comprehensive metabolic panel  Result Value Ref Range   Glucose 90 65 - 99 mg/dL   BUN 8 6 - 24 mg/dL   Creatinine, Ser 1.61 0.57 - 1.00 mg/dL   GFR calc non Af Amer 96 >59 mL/min/1.73   GFR calc Af Amer 111 >59 mL/min/1.73   BUN/Creatinine Ratio 11 9 - 23   Sodium 142 134 - 144 mmol/L   Potassium 4.8 3.5 - 5.2 mmol/L   Chloride 106 96 - 106 mmol/L   CO2 26 20 - 29 mmol/L   Calcium 9.4 8.7 - 10.2 mg/dL   Total Protein 6.6 6.0 - 8.5 g/dL   Albumin 4.4 3.5 - 5.5 g/dL   Globulin, Total 2.2 1.5 - 4.5 g/dL   Albumin/Globulin Ratio 2.0 1.2 - 2.2   Bilirubin Total 0.3 0.0 - 1.2 mg/dL   Alkaline Phosphatase 87 39 - 117 IU/L   AST 15 0 - 40 IU/L   ALT 14 0 - 32 IU/L      Assessment & Plan:   Problem List Items Addressed This  Visit    None    Visit Diagnoses    Acute left-sided back pain with sciatica    -  Primary Pain likely self-limited.  Muscle strain possible complicated by raking leaves.  Plan:  1. Treat with acetaminophen and ibuprofen.  Discussed alternate dosing and max dosing. 2. Apply heat and/or ice to affected area. 3. May also apply a muscle rub with lidocaine after heat or ice. 4. Take muscle relaxer baclofen up to three times daily.  Cautioned drowsiness. 5. Start prednisone taper over 10 days. Take prednisone taper 20 mg tablets  Day 1-4 take 3 pills at one time; Day 5-6: Take 2 pills; Day 7-8: Take 1 pills; Day 9-10: Take 1/2 pill; then stop. 6. Referral to PT today for LBP 7. Follow up 4-6 weeks as needed for persistent or recurrent pain.    Relevant Medications   predniSONE (DELTASONE) 20 MG tablet   baclofen (LIORESAL) 10 MG tablet   Other Relevant Orders   Ambulatory referral to Physical Therapy   Encounter to establish care     Previous PCP was may years ago, but most recent care was provided by [facility] provider Melody [place].  Records are reviewed in [facility].  Past medical, family, and surgical history reviewed w/ pt.       Meds ordered this encounter  Medications  . predniSONE (DELTASONE) 20 MG tablet    Sig: Day 1-4 take 3 pills once daily.  Day 5-6 take 2 pills.  Day 7-8 take 1 pill.  Day 9-10 take 1/2 pill.    Dispense:  19 tablet    Refill:  0    Order Specific Question:   Supervising Provider    Answer:   Smitty Cords [[id number]]  . baclofen (LIORESAL) 10 MG tablet    Sig: Take 1 tablet (10 mg total) by mouth 3 (three) times daily.    Dispense:  30 each    Refill:  0    Order Specific Question:   Supervising Provider    Answer:   Smitty Cords [[id number]]    Follow up plan: Return 4-6 weeks if symptoms worsen or fail to improve.  Wilhelmina Mcardle, DNP, AGPCNP-BC Adult Gerontology Primary Care Nurse Practitioner [facility] 06/16/2017, 12:57 PM

## 2017-06-09 ENCOUNTER — Ambulatory Visit: Payer: BLUE CROSS/BLUE SHIELD | Admitting: Podiatry

## 2017-06-16 ENCOUNTER — Encounter: Payer: Self-pay | Admitting: Nurse Practitioner

## 2018-02-19 ENCOUNTER — Other Ambulatory Visit: Payer: Self-pay | Admitting: Obstetrics and Gynecology

## 2018-02-19 DIAGNOSIS — Z1231 Encounter for screening mammogram for malignant neoplasm of breast: Secondary | ICD-10-CM

## 2018-04-10 ENCOUNTER — Other Ambulatory Visit (HOSPITAL_COMMUNITY)
Admission: RE | Admit: 2018-04-10 | Discharge: 2018-04-10 | Disposition: A | Discharge status: Home / Self Care | Payer: BLUE CROSS/BLUE SHIELD | Source: Ambulatory Visit | Attending: Obstetrics and Gynecology | Admitting: Obstetrics and Gynecology

## 2018-04-10 ENCOUNTER — Ambulatory Visit
Admission: RE | Admit: 2018-04-10 | Discharge: 2018-04-10 | Disposition: A | Discharge status: Home / Self Care | Payer: BLUE CROSS/BLUE SHIELD | Source: Ambulatory Visit | Attending: Obstetrics and Gynecology | Admitting: Obstetrics and Gynecology

## 2018-04-10 ENCOUNTER — Encounter: Payer: Self-pay | Admitting: Obstetrics and Gynecology

## 2018-04-10 ENCOUNTER — Ambulatory Visit (INDEPENDENT_AMBULATORY_CARE_PROVIDER_SITE_OTHER): Payer: BLUE CROSS/BLUE SHIELD | Admitting: Obstetrics and Gynecology

## 2018-04-10 VITALS — BP 110/69 | HR 67 | Ht 62.0 in | Wt 160.5 lb

## 2018-04-10 DIAGNOSIS — Z01419 Encounter for gynecological examination (general) (routine) without abnormal findings: Secondary | ICD-10-CM | POA: Diagnosis not present

## 2018-04-10 DIAGNOSIS — Z23 Encounter for immunization: Secondary | ICD-10-CM | POA: Diagnosis not present

## 2018-04-10 DIAGNOSIS — Z1231 Encounter for screening mammogram for malignant neoplasm of breast: Secondary | ICD-10-CM | POA: Insufficient documentation

## 2018-04-10 MED ORDER — TETANUS-DIPHTH-ACELL PERTUSSIS 5-2.5-18.5 LF-MCG/0.5 IM SUSP
0.5000 mL | Freq: Once | INTRAMUSCULAR | Status: AC
  Administered 2018-04-10: 0.5 mL via INTRAMUSCULAR

## 2018-04-10 NOTE — Patient Instructions (Addendum)
Vit E 800 IU  Daily for a week for breast tenderness. Decrease caffeine intake when happening.

## 2018-04-10 NOTE — Addendum Note (Signed)
Addended by: Rosine BeatLONTZ, Ailynn Gow L on: 04/10/2018 02:09 PM   Modules accepted: Orders

## 2018-04-10 NOTE — Progress Notes (Signed)
Subjective:   Rebekah Rivera is a 47 y.o. G0P0000 Caucasian female here for a routine well-woman exam.  Patient's last menstrual period was 04/03/2018.    Current complaints: breast tenderness around menses.  PCP: Kyung Ruddkennedy       doesn't desire labs  Social History: Sexual: heterosexual Marital Status: married Living situation: with family Occupation: homemaker Tobacco/alcohol: no tobacco use Illicit drugs: no history of illicit drug use  The following portions of the patient's history were reviewed and updated as appropriate: allergies, current medications, past family history, past medical history, past social history, past surgical history and problem list.  Past Medical History Past Medical History:  Diagnosis Date  . GERD (gastroesophageal reflux disease)   . Plantar fasciitis     Past Surgical History History reviewed. No pertinent surgical history.  Gynecologic History G0P0000  Patient's last menstrual period was 04/03/2018. Contraception: none-infertility Last Pap: 2016. Results were: normal Last mammogram: 03/2017. Results were: normal   Obstetric History OB History  Gravida Para Term Preterm AB Living  0 0 0 0 0 0  SAB TAB Ectopic Multiple Live Births  0 0 0 0 0    Current Medications Current Outpatient Medications on File Prior to Visit  Medication Sig Dispense Refill  . calcium carbonate (TUMS - DOSED IN MG ELEMENTAL CALCIUM) 500 MG chewable tablet Chew 1 tablet by mouth daily.    . Multiple Vitamin (MULTIVITAMIN) tablet Take 1 tablet by mouth daily.    Marland Kitchen. omeprazole (PRILOSEC) 20 MG capsule Take 20 mg by mouth daily. For two weeks as needed for heartburn.    . Ascorbic Acid (VITAMIN C) 1000 MG tablet Take 1,000 mg by mouth daily.    . baclofen (LIORESAL) 10 MG tablet Take 1 tablet (10 mg total) by mouth 3 (three) times daily. (Patient not taking: Reported on 04/10/2018) 30 each 0  . diclofenac (VOLTAREN) 75 MG EC tablet Take 1 tablet (75 mg total) by mouth 2  (two) times daily. (Patient not taking: Reported on 04/05/2017) 60 tablet 1  . predniSONE (DELTASONE) 20 MG tablet Day 1-4 take 3 pills once daily.  Day 5-6 take 2 pills.  Day 7-8 take 1 pill.  Day 9-10 take 1/2 pill. (Patient not taking: Reported on 04/10/2018) 19 tablet 0   Current Facility-Administered Medications on File Prior to Visit  Medication Dose Route Frequency Provider Last Rate Last Dose  . betamethasone acetate-betamethasone sodium phosphate (CELESTONE) injection 3 mg  3 mg Intramuscular Once Gala LewandowskyEvans, Brent M, DPM      . betamethasone acetate-betamethasone sodium phosphate (CELESTONE) injection 3 mg  3 mg Intramuscular Once Felecia ShellingEvans, Brent M, DPM        Review of Systems Patient denies any headaches, blurred vision, shortness of breath, chest pain, abdominal pain, problems with bowel movements, urination, or intercourse.  Objective:  BP 110/69   Pulse 67   Ht 5\' 2"  (1.575 m)   Wt 160 lb 8 oz (72.8 kg)   LMP 04/03/2018   BMI 29.36 kg/m  Physical Exam  General:  Well developed, well nourished, no acute distress. She is alert and oriented x3. Skin:  Warm and dry Neck:  Midline trachea, no thyromegaly or nodules Cardiovascular: Regular rate and rhythm, no murmur heard Lungs:  Effort normal, all lung fields clear to auscultation bilaterally Breasts:  No dominant palpable mass, retraction, or nipple discharge Abdomen:  Soft, non tender, no hepatosplenomegaly or masses Pelvic:  External genitalia is normal in appearance.  The vagina is normal in appearance.  The cervix is bulbous, no CMT.  Thin prep pap is done with HR HPV cotesting. Uterus is felt to be normal size, shape, and contour.  No adnexal masses or tenderness noted. Extremities:  No swelling or varicosities noted Psych:  She has a normal mood and affect  Assessment:   Healthy well-woman exam Needs Tdap Needs flu vaccine  Plan:   Tdap & flu vaccine given today F/U 1 year for AE, or sooner if needed Mammogram done  today  Zaelyn Noack Suzan Nailer, CNM

## 2018-04-11 ENCOUNTER — Other Ambulatory Visit: Payer: Self-pay | Admitting: Obstetrics and Gynecology

## 2018-04-11 ENCOUNTER — Telehealth: Payer: Self-pay | Admitting: Obstetrics and Gynecology

## 2018-04-11 DIAGNOSIS — R928 Other abnormal and inconclusive findings on diagnostic imaging of breast: Secondary | ICD-10-CM

## 2018-04-11 DIAGNOSIS — N6310 Unspecified lump in the right breast, unspecified quadrant: Secondary | ICD-10-CM

## 2018-04-11 NOTE — Telephone Encounter (Signed)
The patient called and stated that she would like to speak with a nurse in regards to her having some bruising and side effects from her recent flu injection. Please advise.

## 2018-04-12 LAB — CYTOLOGY - PAP
ADEQUACY: ABSENT
Diagnosis: NEGATIVE
HPV: NOT DETECTED

## 2018-04-18 ENCOUNTER — Ambulatory Visit
Admission: RE | Admit: 2018-04-18 | Discharge: 2018-04-18 | Disposition: A | Discharge status: Home / Self Care | Payer: BLUE CROSS/BLUE SHIELD | Source: Ambulatory Visit | Attending: Obstetrics and Gynecology | Admitting: Obstetrics and Gynecology

## 2018-04-18 DIAGNOSIS — R928 Other abnormal and inconclusive findings on diagnostic imaging of breast: Secondary | ICD-10-CM

## 2018-04-18 DIAGNOSIS — N6311 Unspecified lump in the right breast, upper outer quadrant: Secondary | ICD-10-CM | POA: Diagnosis not present

## 2018-04-18 DIAGNOSIS — N6310 Unspecified lump in the right breast, unspecified quadrant: Secondary | ICD-10-CM | POA: Diagnosis not present

## 2018-04-18 DIAGNOSIS — R922 Inconclusive mammogram: Secondary | ICD-10-CM | POA: Diagnosis not present

## 2018-04-19 ENCOUNTER — Other Ambulatory Visit: Payer: Self-pay | Admitting: Obstetrics and Gynecology

## 2018-04-19 DIAGNOSIS — N6310 Unspecified lump in the right breast, unspecified quadrant: Secondary | ICD-10-CM

## 2018-05-25 IMAGING — MG MM DIGITAL DIAGNOSTIC UNILAT*R* W/ TOMO W/ CAD
6 series · 6 of 14 positions shown · non-contrast
Comparison: Previous exam(s).

CLINICAL DATA: Screening recall for a possible mass in the right
breast.

EXAM:
2D DIGITAL DIAGNOSTIC RIGHT MAMMOGRAM WITH CAD AND ADJUNCT TOMO
ULTRASOUND RIGHT BREAST

[R CC]
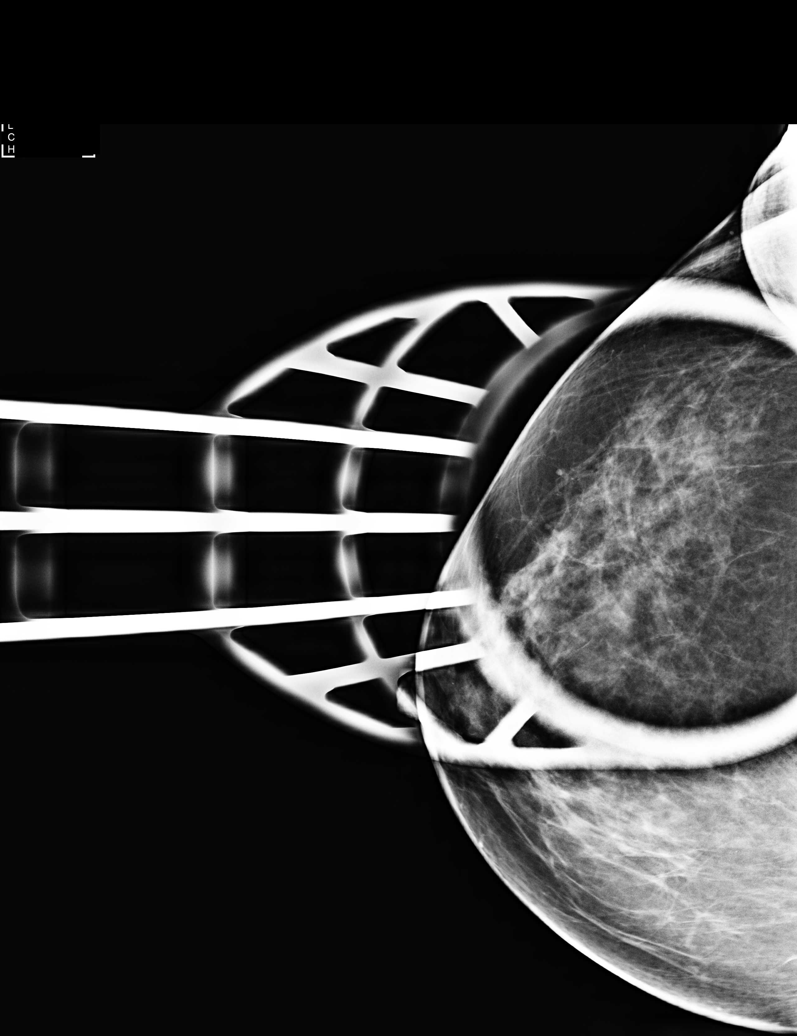

[R MLO]
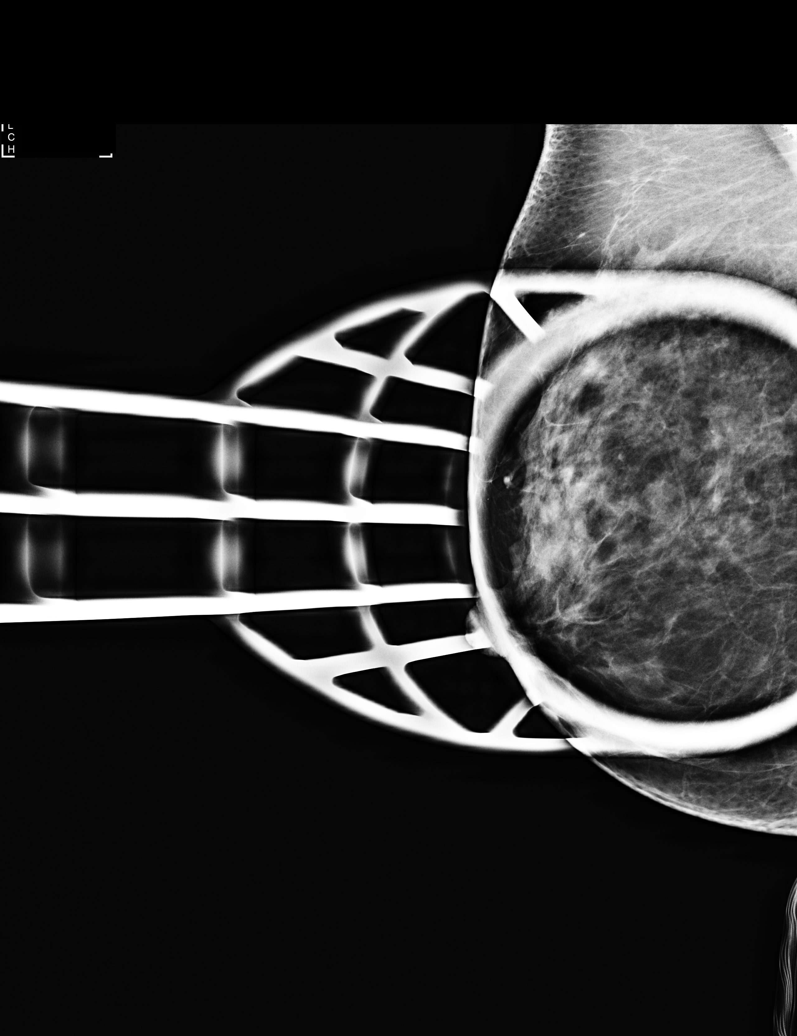

[R CC synth-2D]
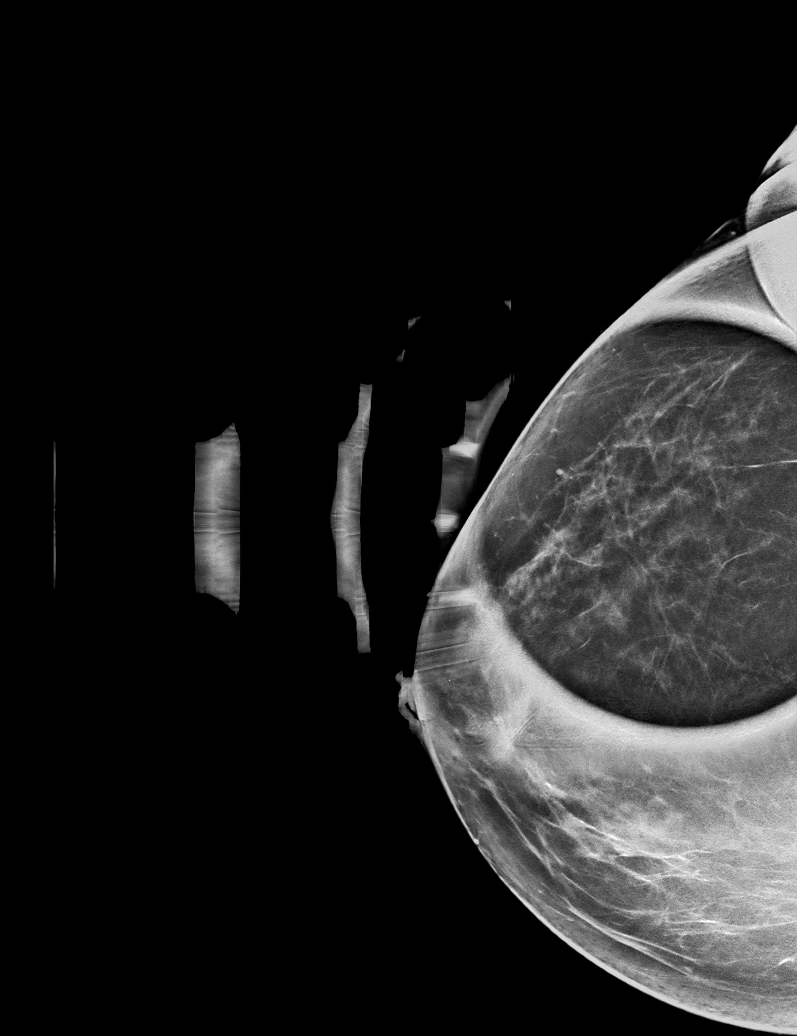

[R MLO synth-2D]
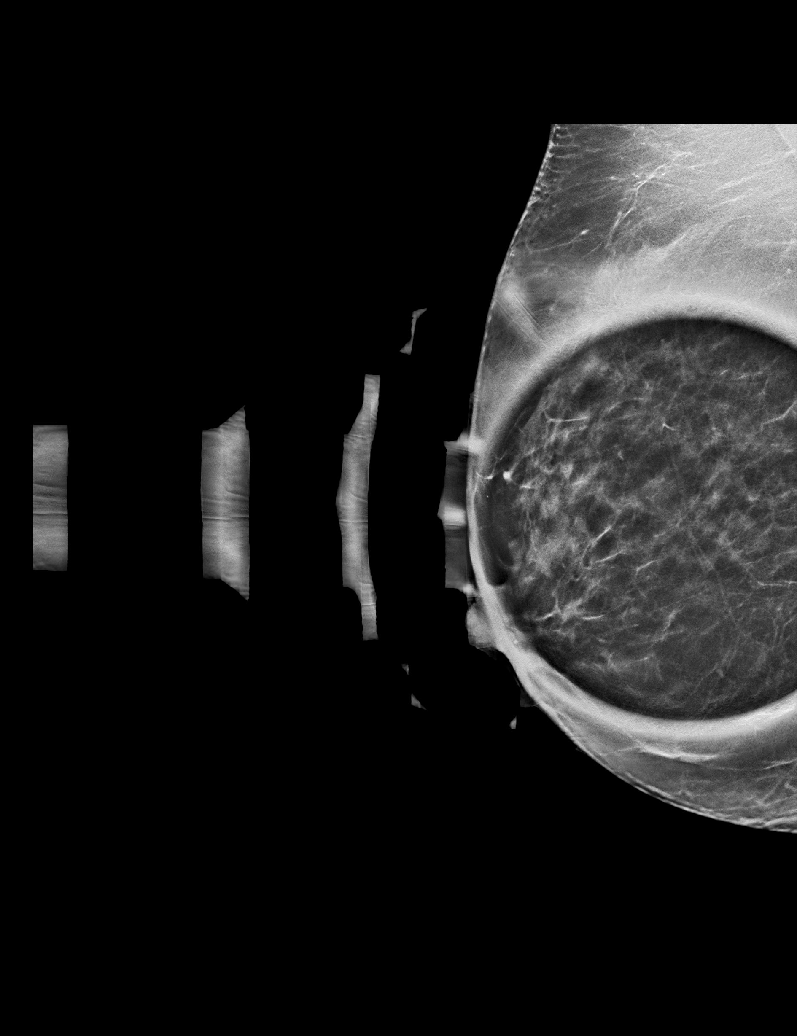

[R MLO tomo · tomo slice 26/51.0]
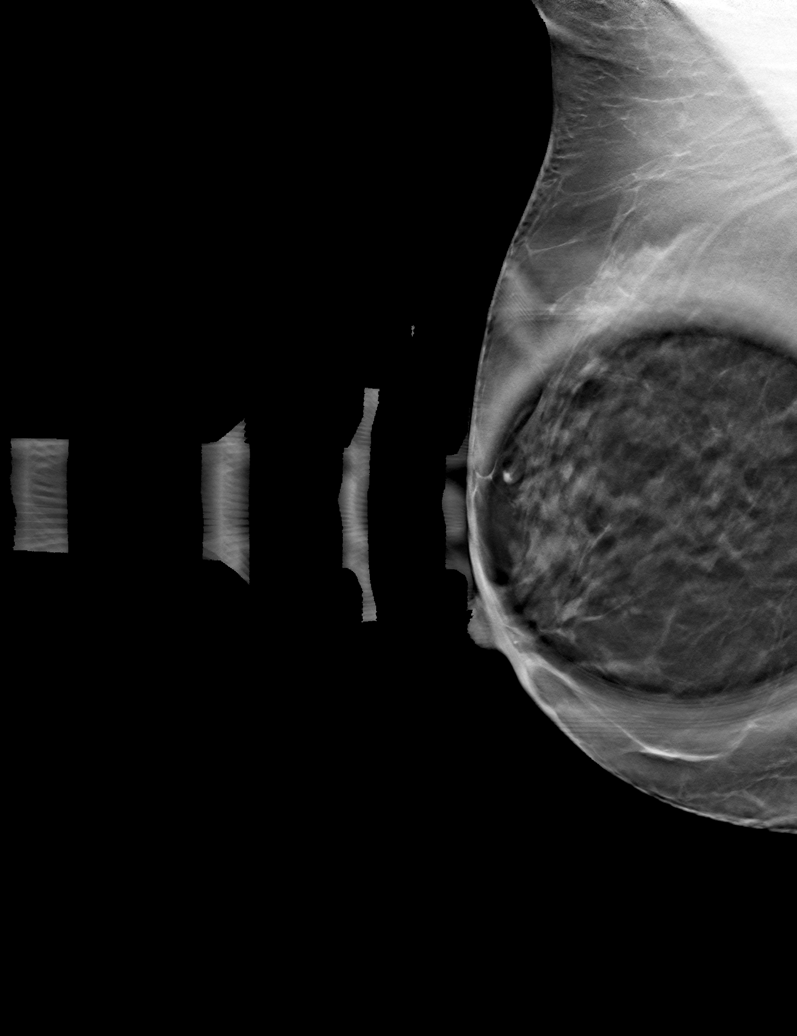

[R CC tomo · tomo slice 30/59.0]
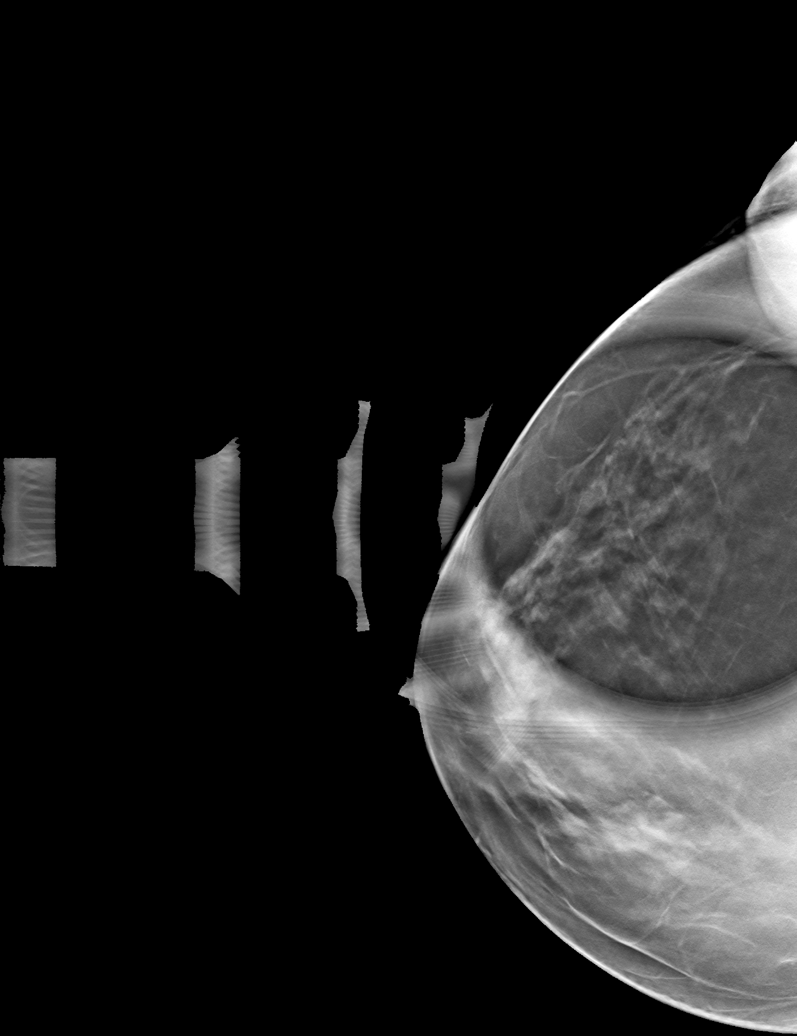

[6 of 14 positions shown; findings below may reference images not displayed]

ACR Breast Density Category c: The breast tissue is heterogeneously
dense, which may obscure small masses.
FINDINGS: The possible mass noted in the lateral right breast persists on
diagnostic imaging. It appears as a faint oval circumscribed mass in
the central to slightly posterior depth.

Mammographic images were processed with CAD.

On physical exam, no mass is palpated in the lateral right breast.

Targeted ultrasound is performed, showing an oval cyst with a thin
septation, versus 2 contiguous cysts, in the 9 o'clock position of
the right breast, 2 cm the nipple, measuring a total of 6 x 5 x 4
mm. This is consistent in size, shape and location to the
mammographic finding. There are no solid masses or suspicious
lesions.
IMPRESSION: 1. No evidence of breast malignancy.
2. Benign right breast cyst

RECOMMENDATION:
Screening mammogram in one year.(Code:[id number]-[id number])

I have discussed the findings and recommendations with the patient.
Results were also provided in writing at the conclusion of the
visit. If applicable, a reminder letter will be sent to the patient
regarding the next appointment.

BI-RADS CATEGORY  2: Benign.

## 2018-09-03 ENCOUNTER — Other Ambulatory Visit: Payer: Self-pay | Admitting: Obstetrics and Gynecology

## 2018-09-03 DIAGNOSIS — N6310 Unspecified lump in the right breast, unspecified quadrant: Secondary | ICD-10-CM

## 2018-10-23 ENCOUNTER — Ambulatory Visit: Payer: BLUE CROSS/BLUE SHIELD

## 2018-10-23 ENCOUNTER — Other Ambulatory Visit: Payer: BLUE CROSS/BLUE SHIELD

## 2019-04-12 ENCOUNTER — Encounter: Payer: BLUE CROSS/BLUE SHIELD | Admitting: Obstetrics and Gynecology

## 2019-04-19 ENCOUNTER — Encounter: Payer: BLUE CROSS/BLUE SHIELD | Admitting: Obstetrics and Gynecology

## 2019-05-01 ENCOUNTER — Ambulatory Visit (INDEPENDENT_AMBULATORY_CARE_PROVIDER_SITE_OTHER): Payer: BC Managed Care – PPO | Admitting: Certified Nurse Midwife

## 2019-05-01 ENCOUNTER — Encounter: Payer: Self-pay | Admitting: Certified Nurse Midwife

## 2019-05-01 ENCOUNTER — Other Ambulatory Visit: Payer: Self-pay

## 2019-05-01 VITALS — BP 99/58 | HR 67 | Ht 62.0 in | Wt 161.0 lb

## 2019-05-01 DIAGNOSIS — Z1231 Encounter for screening mammogram for malignant neoplasm of breast: Secondary | ICD-10-CM

## 2019-05-01 DIAGNOSIS — Z01419 Encounter for gynecological examination (general) (routine) without abnormal findings: Secondary | ICD-10-CM

## 2019-05-01 NOTE — Progress Notes (Signed)
GYNECOLOGY ANNUAL PREVENTATIVE CARE ENCOUNTER NOTE  History:     Terena Bohan is a 48 y.o. G0P0000 female here for a routine annual gynecologic exam.  Current complaints: None.   Denies abnormal vaginal bleeding, discharge, pelvic pain, problems with intercourse or other gynecologic concerns.      Married X 30 yrs Adopted 3 children  Gynecologic History Patient's last menstrual period was 04/23/2019 (exact date).Regular periods q 3-3.5 wks Contraception: none Last Pap: 04/10/2018. Results were: normal with negative HPV Last mammogram: 04/10/2018. Results were: abnormal  Obstetric History OB History  Gravida Para Term Preterm AB Living  0 0 0 0 0 0  SAB TAB Ectopic Multiple Live Births  0 0 0 0 0    Past Medical History:  Diagnosis Date  . GERD (gastroesophageal reflux disease)   . Plantar fasciitis     No past surgical history on file.  Current Outpatient Medications on File Prior to Visit  Medication Sig Dispense Refill  . Ascorbic Acid (VITAMIN C) 1000 MG tablet Take 1,000 mg by mouth daily.    . calcium carbonate (TUMS - DOSED IN MG ELEMENTAL CALCIUM) 500 MG chewable tablet Chew 1 tablet by mouth daily.    . Multiple Vitamin (MULTIVITAMIN) tablet Take 1 tablet by mouth daily.    Marland Kitchen omeprazole (PRILOSEC) 20 MG capsule Take 20 mg by mouth daily. For two weeks as needed for heartburn.    . baclofen (LIORESAL) 10 MG tablet Take 1 tablet (10 mg total) by mouth 3 (three) times daily. (Patient not taking: Reported on 04/10/2018) 30 each 0  . diclofenac (VOLTAREN) 75 MG EC tablet Take 1 tablet (75 mg total) by mouth 2 (two) times daily. (Patient not taking: Reported on 04/05/2017) 60 tablet 1  . predniSONE (DELTASONE) 20 MG tablet Day 1-4 take 3 pills once daily.  Day 5-6 take 2 pills.  Day 7-8 take 1 pill.  Day 9-10 take 1/2 pill. (Patient not taking: Reported on 04/10/2018) 19 tablet 0   Current Facility-Administered Medications on File Prior to Visit  Medication Dose Route  Frequency Provider Last Rate Last Dose  . betamethasone acetate-betamethasone sodium phosphate (CELESTONE) injection 3 mg  3 mg Intramuscular Once Daylene Katayama M, DPM      . betamethasone acetate-betamethasone sodium phosphate (CELESTONE) injection 3 mg  3 mg Intramuscular Once Edrick Kins, DPM        No Known Allergies  Social History:  reports that she has never smoked. She has never used smokeless tobacco. She reports that she does not drink alcohol or use drugs.  Exercises 2-3 times wkl for 30-45 min.  Denies smoking, drinking, and drug use.   Family History  Problem Relation Age of Onset  . Osteoporosis Mother   . Cancer Father        prostate, skin  . Osteoporosis Father   . Prostate cancer Father   . Lung cancer Other   . Breast cancer Neg Hx     The following portions of the patient's history were reviewed and updated as appropriate: allergies, current medications, past family history, past medical history, past social history, past surgical history and problem list.  Review of Systems Pertinent items noted in HPI and remainder of comprehensive ROS otherwise negative.  Physical Exam:  BP (!) 99/58   Pulse 67   Ht 5\' 2"  (1.575 m)   Wt 161 lb (73 kg)   LMP 04/23/2019 (Exact Date)   BMI 29.45 kg/m  CONSTITUTIONAL: Well-developed, well-nourished female in  no acute distress.  HENT:  Normocephalic, atraumatic, External right and left ear normal. Oropharynx is clear and moist EYES: Conjunctivae and EOM are normal. Pupils are equal, round, and reactive to light. No scleral icterus.  NECK: Normal range of motion, supple, no masses.  Normal thyroid.  SKIN: Skin is warm and dry. No rash noted. Not diaphoretic. No erythema. No pallor. MUSCULOSKELETAL: Normal range of motion. No tenderness.  No cyanosis, clubbing, or edema.  2+ distal pulses. NEUROLOGIC: Alert and oriented to person, place, and time. Normal reflexes, muscle tone coordination.  PSYCHIATRIC: Normal mood and  affect. Normal behavior. Normal judgment and thought content. CARDIOVASCULAR: Normal heart rate noted, regular rhythm RESPIRATORY: Clear to auscultation bilaterally. Effort and breath sounds normal, no problems with respiration noted. BREASTS: Symmetric in size. No masses, tenderness, skin changes, nipple drainage, or lymphadenopathy bilaterally. ABDOMEN: Soft, no distention noted.  No tenderness, rebound or guarding.  PELVIC: Normal appearing external genitalia and urethral meatus; normal appearing vaginal mucosa and cervix.  No abnormal discharge noted.  .  Normal uterine size, no other palpable masses, no uterine or adnexal tenderness.   Assessment and Plan:  Annual Well Women GYN Exam  Pap smear not indicated  Mammogram scheduled Colonoscopy: pt declines at this time.  Labs: TSH . Glucose & lipid panel completed (copy scanned into chart) Refill: none Routine preventative health maintenance measures emphasized. Please refer to After Visit Summary for other counseling recommendations.      Doreene Burke, CNM

## 2019-05-01 NOTE — Patient Instructions (Signed)
Colonoscopy, Adult A colonoscopy is an exam to look at the large intestine. It is done to check for problems, such as:  Lumps (tumors).  Growths (polyps).  Swelling (inflammation).  Bleeding. What happens before the procedure? Eating and drinking Follow instructions from your doctor about eating and drinking. These instructions may include:  A few days before the procedure - follow a low-fiber diet. ? Avoid nuts. ? Avoid seeds. ? Avoid dried fruit. ? Avoid raw fruits. ? Avoid vegetables.  1-3 days before the procedure - follow a clear liquid diet. Avoid liquids that have red or purple dye. Drink only clear liquids, such as: ? Clear broth or bouillon. ? Black coffee or tea. ? Clear juice. ? Clear soft drinks or sports drinks. ? Gelatin dessert. ? Popsicles.  On the day of the procedure - do not eat or drink anything during the 2 hours before the procedure. Up to 2 hours before the procedure, you may continue to drink clear liquids, such as water or clear fruit juice.  Bowel prep If you were prescribed an oral bowel prep:  Take it as told by your doctor. Starting the day before your procedure, you will need to drink a lot of liquid. The liquid will cause you to poop (have bowel movements) until your poop is almost clear or light green.  To clean out your colon, you may also be given: ? Laxative medicines. ? Instructions about how to use an enema.  If your skin or butt gets irritated from diarrhea, you may: ? Wipe the area with wipes that have medicine in them, such as adult wet wipes with aloe and vitamin E. ? Put something on your skin that soothes the area, such as petroleum jelly.  If you throw up (vomit) while drinking the bowel prep, take a break for up to 60 minutes. Then begin the bowel prep again. If you keep throwing up and you cannot take the bowel prep without throwing up, call your doctor. General instructions  Ask your doctor about: ? Changing or stopping  your normal medicines. This is important if you take iron pills, diabetes medicines, or blood thinners. ? Taking medicines such as aspirin and ibuprofen. These medicines can thin your blood. Do not take these medicines unless your doctor tells you to take them.  Plan to have someone take you home from the hospital or clinic. What happens during the procedure?   An IV tube may be put into one of your veins.  You will be given medicine to help you relax (sedative).  To reduce your risk of infection: ? Your doctors will wash their hands. ? Your anal area will be washed with soap.  You will be asked to lie on your side with your knees bent.  Your doctor will get a long, thin, flexible tube ready. The tube will have a camera and a light on the end.  The tube will be put into your anus.  The tube will be gently put into your large intestine.  Air will be delivered into your large intestine to keep it open. You may feel some pressure or cramping.  The camera will be used to take photos.  A small tissue sample may be removed for testing (biopsy).  If small growths are found, your doctor may remove them and have them checked for cancer.  The tube that was put into your anus will be slowly removed. The procedure may vary among doctors and hospitals. What happens after the  procedure?  Your doctor will check on you often until the medicines you were given have worn off.  Do not drive for 24 hours after the procedure.  You may have a small amount of blood in your poop.  You may pass gas.  You may have mild cramps or bloating in your belly (abdomen).  It is up to you to get the results of your procedure. Ask your doctor, or the department performing the procedure, when your results will be ready. Summary  A colonoscopy is an exam to look at the large intestine.  Follow instructions from your doctor about eating and drinking before the procedure.  If you were prescribed an oral  bowel prep to clean out your colon, take it as told by your doctor.  Your doctor will check on you often until the medicines you were given have worn off.  Plan to have someone take you home from the hospital or clinic. This information is not intended to replace advice given to you by your health care provider. Make sure you discuss any questions you have with your health care provider. Document Released: 06/18/2010 Document Revised: 03/15/2017 Document Reviewed: 07/28/2015 Elsevier Patient Education  2020 [employer] Preventive Care 47-31 Years Old, Female Preventive care refers to visits with your health care provider and lifestyle choices that can promote health and wellness. This includes:  A yearly physical exam. This may also be called an annual well check.  Regular dental visits and eye exams.  Immunizations.  Screening for certain conditions.  Healthy lifestyle choices, such as eating a healthy diet, getting regular exercise, not using drugs or products that contain nicotine and tobacco, and limiting alcohol use. What can I expect for my preventive care visit? Physical exam Your health care provider will check your:  Height and weight. This may be used to calculate body mass index (BMI), which tells if you are at a healthy weight.  Heart rate and blood pressure.  Skin for abnormal spots. Counseling Your health care provider may ask you questions about your:  Alcohol, tobacco, and drug use.  Emotional well-being.  Home and relationship well-being.  Sexual activity.  Eating habits.  Work and work Statistician.  Method of birth control.  Menstrual cycle.  Pregnancy history. What immunizations do I need?  Influenza (flu) vaccine  This is recommended every year. Tetanus, diphtheria, and pertussis (Tdap) vaccine  You may need a Td booster every 10 years. Varicella (chickenpox) vaccine  You may need this if you have not been vaccinated. Zoster  (shingles) vaccine  You may need this after age 19. Measles, mumps, and rubella (MMR) vaccine  You may need at least one dose of MMR if you were born in 1957 or later. You may also need a second dose. Pneumococcal conjugate (PCV13) vaccine  You may need this if you have certain conditions and were not previously vaccinated. Pneumococcal polysaccharide (PPSV23) vaccine  You may need one or two doses if you smoke cigarettes or if you have certain conditions. Meningococcal conjugate (MenACWY) vaccine  You may need this if you have certain conditions. Hepatitis A vaccine  You may need this if you have certain conditions or if you travel or work in places where you may be exposed to hepatitis A. Hepatitis B vaccine  You may need this if you have certain conditions or if you travel or work in places where you may be exposed to hepatitis B. Haemophilus influenzae type b (Hib) vaccine  You may need  this if you have certain conditions. Human papillomavirus (HPV) vaccine  If recommended by your health care provider, you may need three doses over 6 months. You may receive vaccines as individual doses or as more than one vaccine together in one shot (combination vaccines). Talk with your health care provider about the risks and benefits of combination vaccines. What tests do I need? Blood tests  Lipid and cholesterol levels. These may be checked every 5 years, or more frequently if you are over 83 years old.  Hepatitis C test.  Hepatitis B test. Screening  Lung cancer screening. You may have this screening every year starting at age 86 if you have a 30-pack-year history of smoking and currently smoke or have quit within the past 15 years.  Colorectal cancer screening. All adults should have this screening starting at age 71 and continuing until age 69. Your health care provider may recommend screening at age 9 if you are at increased risk. You will have tests every 1-10 years, depending  on your results and the type of screening test.  Diabetes screening. This is done by checking your blood sugar (glucose) after you have not eaten for a while (fasting). You may have this done every 1-3 years.  Mammogram. This may be done every 1-2 years. Talk with your health care provider about when you should start having regular mammograms. This may depend on whether you have a family history of breast cancer.  BRCA-related cancer screening. This may be done if you have a family history of breast, ovarian, tubal, or peritoneal cancers.  Pelvic exam and Pap test. This may be done every 3 years starting at age 22. Starting at age 74, this may be done every 5 years if you have a Pap test in combination with an HPV test. Other tests  Sexually transmitted disease (STD) testing.  Bone density scan. This is done to screen for osteoporosis. You may have this scan if you are at high risk for osteoporosis. Follow these instructions at home: Eating and drinking  Eat a diet that includes fresh fruits and vegetables, whole grains, lean protein, and low-fat dairy.  Take vitamin and mineral supplements as recommended by your health care provider.  Do not drink alcohol if: ? Your health care provider tells you not to drink. ? You are pregnant, may be pregnant, or are planning to become pregnant.  If you drink alcohol: ? Limit how much you have to 0-1 drink a day. ? Be aware of how much alcohol is in your drink. In the [place] one drink equals one 12 oz bottle of beer (355 mL), one 5 oz glass of wine (148 mL), or one 1 oz glass of hard liquor (44 mL). Lifestyle  Take daily care of your teeth and gums.  Stay active. Exercise for at least 30 minutes on 5 or more days each week.  Do not use any products that contain nicotine or tobacco, such as cigarettes, e-cigarettes, and chewing tobacco. If you need help quitting, ask your health care provider.  If you are sexually active, practice safe sex. Use  a condom or other form of birth control (contraception) in order to prevent pregnancy and STIs (sexually transmitted infections).  If told by your health care provider, take low-dose aspirin daily starting at age 15. What's next?  Visit your health care provider once a year for a well check visit.  Ask your health care provider how often you should have your eyes and teeth checked.  Stay up to date on all vaccines. This information is not intended to replace advice given to you by your health care provider. Make sure you discuss any questions you have with your health care provider. Document Released: 06/12/2015 Document Revised: 01/25/2018 Document Reviewed: 01/25/2018 Elsevier Patient Education  2020 [employer].

## 2019-05-02 ENCOUNTER — Other Ambulatory Visit: Payer: Self-pay | Admitting: Certified Nurse Midwife

## 2019-05-02 DIAGNOSIS — R928 Other abnormal and inconclusive findings on diagnostic imaging of breast: Secondary | ICD-10-CM

## 2019-05-02 DIAGNOSIS — N6310 Unspecified lump in the right breast, unspecified quadrant: Secondary | ICD-10-CM

## 2019-05-02 DIAGNOSIS — Z1231 Encounter for screening mammogram for malignant neoplasm of breast: Secondary | ICD-10-CM

## 2019-05-02 LAB — THYROID PANEL WITH TSH
Free Thyroxine Index: 2.1 (ref 1.2–4.9)
T3 Uptake Ratio: 28 % (ref 24–39)
T4, Total: 7.4 ug/dL (ref 4.5–12.0)
TSH: 1.74 u[IU]/mL (ref 0.450–4.500)

## 2019-06-05 ENCOUNTER — Other Ambulatory Visit: Payer: BLUE CROSS/BLUE SHIELD

## 2019-06-19 ENCOUNTER — Ambulatory Visit
Admission: RE | Admit: 2019-06-19 | Discharge: 2019-06-19 | Disposition: A | Discharge status: Home / Self Care | Payer: BC Managed Care – PPO | Source: Ambulatory Visit | Attending: Certified Nurse Midwife | Admitting: Certified Nurse Midwife

## 2019-06-19 DIAGNOSIS — R928 Other abnormal and inconclusive findings on diagnostic imaging of breast: Secondary | ICD-10-CM | POA: Diagnosis not present

## 2019-06-19 DIAGNOSIS — N6310 Unspecified lump in the right breast, unspecified quadrant: Secondary | ICD-10-CM

## 2019-06-19 DIAGNOSIS — Z1231 Encounter for screening mammogram for malignant neoplasm of breast: Secondary | ICD-10-CM | POA: Insufficient documentation

## 2019-06-19 DIAGNOSIS — N6311 Unspecified lump in the right breast, upper outer quadrant: Secondary | ICD-10-CM | POA: Diagnosis not present

## 2019-06-19 DIAGNOSIS — R922 Inconclusive mammogram: Secondary | ICD-10-CM | POA: Diagnosis not present

## 2019-06-19 DIAGNOSIS — N6001 Solitary cyst of right breast: Secondary | ICD-10-CM | POA: Diagnosis not present

## 2020-05-04 ENCOUNTER — Encounter: Payer: Self-pay | Admitting: Certified Nurse Midwife

## 2020-05-04 ENCOUNTER — Other Ambulatory Visit: Payer: Self-pay

## 2020-05-04 ENCOUNTER — Ambulatory Visit (INDEPENDENT_AMBULATORY_CARE_PROVIDER_SITE_OTHER): Payer: BC Managed Care – PPO | Admitting: Certified Nurse Midwife

## 2020-05-04 VITALS — BP 108/54 | HR 61 | Ht 62.0 in | Wt 149.1 lb

## 2020-05-04 DIAGNOSIS — Z1159 Encounter for screening for other viral diseases: Secondary | ICD-10-CM

## 2020-05-04 DIAGNOSIS — Z01419 Encounter for gynecological examination (general) (routine) without abnormal findings: Secondary | ICD-10-CM

## 2020-05-04 DIAGNOSIS — Z114 Encounter for screening for human immunodeficiency virus [HIV]: Secondary | ICD-10-CM

## 2020-05-04 DIAGNOSIS — Z1211 Encounter for screening for malignant neoplasm of colon: Secondary | ICD-10-CM | POA: Diagnosis not present

## 2020-05-04 DIAGNOSIS — Z1231 Encounter for screening mammogram for malignant neoplasm of breast: Secondary | ICD-10-CM

## 2020-05-04 NOTE — Progress Notes (Signed)
GYNECOLOGY ANNUAL PREVENTATIVE CARE ENCOUNTER NOTE  History:     Rebekah Rivera is a 49 y.o. G0P0000 female here for a routine annual gynecologic exam.  Current complaints: none.   Denies abnormal vaginal bleeding, discharge, pelvic pain, problems with intercourse or other gynecologic concerns.  Her husband had a heart attack this past year which has lead to some healthier eating habits, resulting in weight loss.     Social Relationship: married  Living:with spouse ( kids moved out) Work:  Arts development officer Exercise:2-3 x wk for 30-45 min. Smoke/Alcohol/drug OYD:XAJOIN  Gynecologic History No LMP recorded. Contraception: none Last Pap: 04/10/2018. Results were: normal with negative HPV Last mammogram: 05/31/19. Results were: normal  The pregnancy intention screening data noted above was reviewed. Potential methods of contraception were discussed. The patient elected to proceed with No Method - Other Reason.   Obstetric History OB History  Gravida Para Term Preterm AB Living  0 0 0 0 0 0  SAB TAB Ectopic Multiple Live Births  0 0 0 0 0    Past Medical History:  Diagnosis Date  . GERD (gastroesophageal reflux disease)   . Plantar fasciitis     No past surgical history on file.  Current Outpatient Medications on File Prior to Visit  Medication Sig Dispense Refill  . Ascorbic Acid (VITAMIN C) 1000 MG tablet Take 1,000 mg by mouth daily.    . baclofen (LIORESAL) 10 MG tablet Take 1 tablet (10 mg total) by mouth 3 (three) times daily. (Patient not taking: Reported on 04/10/2018) 30 each 0  . calcium carbonate (TUMS - DOSED IN MG ELEMENTAL CALCIUM) 500 MG chewable tablet Chew 1 tablet by mouth daily.    . diclofenac (VOLTAREN) 75 MG EC tablet Take 1 tablet (75 mg total) by mouth 2 (two) times daily. (Patient not taking: Reported on 04/05/2017) 60 tablet 1  . Multiple Vitamin (MULTIVITAMIN) tablet Take 1 tablet by mouth daily.    Marland Kitchen omeprazole (PRILOSEC) 20 MG capsule Take 20 mg by  mouth daily. For two weeks as needed for heartburn.    . predniSONE (DELTASONE) 20 MG tablet Day 1-4 take 3 pills once daily.  Day 5-6 take 2 pills.  Day 7-8 take 1 pill.  Day 9-10 take 1/2 pill. (Patient not taking: Reported on 04/10/2018) 19 tablet 0   Current Facility-Administered Medications on File Prior to Visit  Medication Dose Route Frequency Provider Last Rate Last Admin  . betamethasone acetate-betamethasone sodium phosphate (CELESTONE) injection 3 mg  3 mg Intramuscular Once Gala Lewandowsky M, DPM      . betamethasone acetate-betamethasone sodium phosphate (CELESTONE) injection 3 mg  3 mg Intramuscular Once Felecia Shelling, DPM        No Known Allergies  Social History:  reports that she has never smoked. She has never used smokeless tobacco. She reports that she does not drink alcohol and does not use drugs.  Family History  Problem Relation Age of Onset  . Osteoporosis Mother   . Cancer Father        prostate, skin  . Osteoporosis Father   . Prostate cancer Father   . Lung cancer Other   . Breast cancer Neg Hx     The following portions of the patient's history were reviewed and updated as appropriate: allergies, current medications, past family history, past medical history, past social history, past surgical history and problem list.  Review of Systems Pertinent items noted in HPI and remainder of comprehensive ROS otherwise  negative.  Physical Exam:  BP (!) 108/54   Pulse 61   Ht 5\' 2"  (1.575 m)   Wt 149 lb 2 oz (67.6 kg)   BMI 27.28 kg/m  CONSTITUTIONAL: Well-developed, well-nourished female in no acute distress.  HENT:  Normocephalic, atraumatic, External right and left ear normal. Oropharynx is clear and moist EYES: Conjunctivae and EOM are normal. Pupils are equal, round, and reactive to light. No scleral icterus.  NECK: Normal range of motion, supple, no masses.  Normal thyroid.  SKIN: Skin is warm and dry. No rash noted. Not diaphoretic. No erythema. No  pallor. MUSCULOSKELETAL: Normal range of motion. No tenderness.  No cyanosis, clubbing, or edema.  2+ distal pulses. NEUROLOGIC: Alert and oriented to person, place, and time. Normal reflexes, muscle tone coordination.  PSYCHIATRIC: Normal mood and affect. Normal behavior. Normal judgment and thought content. CARDIOVASCULAR: Normal heart rate noted, regular rhythm RESPIRATORY: Clear to auscultation bilaterally. Effort and breath sounds normal, no problems with respiration noted. BREASTS: Symmetric in size. No masses, tenderness, skin changes, nipple drainage, or lymphadenopathy bilaterally.  ABDOMEN: Soft, no distention noted.  No tenderness, rebound or guarding.  PELVIC: Normal appearing external genitalia and urethral meatus; normal appearing vaginal mucosa and cervix.  No abnormal discharge noted.  Pap smear not due  Normal uterine size, no other palpable masses, no uterine or adnexal tenderness.  .   Assessment and Plan:    1. Women's annual routine gynecological examination  Pap: not indicated Mammogram :ordered  Labs: hem A 1 c, HIV, Hep C Refills/orders:cologaurd Referral: none Routine preventative health maintenance measures emphasized. Please refer to After Visit Summary for other counseling recommendations.      , CNM [facility],  [facility]

## 2020-05-04 NOTE — Patient Instructions (Signed)

## 2020-05-05 LAB — HIV ANTIBODY (ROUTINE TESTING W REFLEX): HIV Screen 4th Generation wRfx: NONREACTIVE

## 2020-05-05 LAB — HEPATITIS C ANTIBODY: Hep C Virus Ab: <0.1 s/co ratio (ref 0.0–0.9)

## 2020-05-05 LAB — HEMOGLOBIN A1C
Est. average glucose Bld gHb Est-mCnc: 94 mg/dL
Hgb A1c MFr Bld: 4.9 % (ref 4.8–5.6)

## 2020-05-08 LAB — COLOGUARD: Cologuard: NEGATIVE

## 2020-05-17 LAB — EXTERNAL GENERIC LAB PROCEDURE: COLOGUARD: NEGATIVE

## 2020-05-17 LAB — COLOGUARD: COLOGUARD: NEGATIVE

## 2020-05-21 ENCOUNTER — Other Ambulatory Visit: Payer: Self-pay | Admitting: Certified Nurse Midwife

## 2020-05-21 DIAGNOSIS — N6001 Solitary cyst of right breast: Secondary | ICD-10-CM

## 2020-06-19 ENCOUNTER — Other Ambulatory Visit: Payer: BLUE CROSS/BLUE SHIELD

## 2020-07-15 ENCOUNTER — Other Ambulatory Visit: Payer: Self-pay | Admitting: Certified Nurse Midwife

## 2020-07-15 ENCOUNTER — Ambulatory Visit
Admission: RE | Admit: 2020-07-15 | Discharge: 2020-07-15 | Disposition: A | Discharge status: Home / Self Care | Payer: BC Managed Care – PPO | Source: Ambulatory Visit | Attending: Certified Nurse Midwife | Admitting: Certified Nurse Midwife

## 2020-07-15 ENCOUNTER — Other Ambulatory Visit: Payer: Self-pay

## 2020-07-15 DIAGNOSIS — N6320 Unspecified lump in the left breast, unspecified quadrant: Secondary | ICD-10-CM

## 2020-07-15 DIAGNOSIS — N6001 Solitary cyst of right breast: Secondary | ICD-10-CM | POA: Diagnosis not present

## 2021-05-10 ENCOUNTER — Encounter: Payer: Self-pay | Admitting: Certified Nurse Midwife

## 2021-06-15 ENCOUNTER — Other Ambulatory Visit: Payer: Self-pay

## 2021-06-15 ENCOUNTER — Ambulatory Visit (INDEPENDENT_AMBULATORY_CARE_PROVIDER_SITE_OTHER): Payer: BC Managed Care – PPO | Admitting: Certified Nurse Midwife

## 2021-06-15 VITALS — BP 109/73 | HR 118 | Ht 62.0 in | Wt 154.9 lb

## 2021-06-15 DIAGNOSIS — Z131 Encounter for screening for diabetes mellitus: Secondary | ICD-10-CM | POA: Diagnosis not present

## 2021-06-15 DIAGNOSIS — Z1231 Encounter for screening mammogram for malignant neoplasm of breast: Secondary | ICD-10-CM

## 2021-06-15 DIAGNOSIS — Z01419 Encounter for gynecological examination (general) (routine) without abnormal findings: Secondary | ICD-10-CM | POA: Diagnosis not present

## 2021-06-15 DIAGNOSIS — Z1329 Encounter for screening for other suspected endocrine disorder: Secondary | ICD-10-CM

## 2021-06-15 DIAGNOSIS — Z13220 Encounter for screening for lipoid disorders: Secondary | ICD-10-CM

## 2021-06-15 NOTE — Progress Notes (Signed)
GYNECOLOGY ANNUAL PREVENTATIVE CARE ENCOUNTER NOTE  History:     Rebekah Rivera is a 51 y.o. G0P0000 female here for a routine annual gynecologic exam.  Current complaints: none.   Denies abnormal vaginal bleeding, discharge, pelvic pain, problems with intercourse or other gynecologic concerns.     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 Contraception: none Last Pap: 04/10/2018. Results were: normal with negative HPV Last mammogram: 07/15/2020. Results were: normal  Obstetric History OB History  Gravida Para Term Preterm AB Living  0 0 0 0 0 0  SAB IAB Ectopic Multiple Live Births  0 0 0 0 0    Past Medical History:  Diagnosis Date   GERD (gastroesophageal reflux disease)    Plantar fasciitis     No past surgical history on file.  Current Outpatient Medications on File Prior to Visit  Medication Sig Dispense Refill   Ascorbic Acid (VITAMIN C) 1000 MG tablet Take 1,000 mg by mouth daily.     calcium carbonate (TUMS - DOSED IN MG ELEMENTAL CALCIUM) 500 MG chewable tablet Chew 1 tablet by mouth daily.     Multiple Vitamin (MULTIVITAMIN) tablet Take 1 tablet by mouth daily.     omeprazole (PRILOSEC) 20 MG capsule Take 20 mg by mouth daily. For two weeks as needed for heartburn.     Current Facility-Administered Medications on File Prior to Visit  Medication Dose Route Frequency Provider Last Rate Last Admin   betamethasone acetate-betamethasone sodium phosphate (CELESTONE) injection 3 mg  3 mg Intramuscular Once Gala Lewandowsky M, DPM       betamethasone acetate-betamethasone sodium phosphate (CELESTONE) injection 3 mg  3 mg Intramuscular Once Felecia Shelling, DPM        No Known Allergies  Social History:  reports that she has never smoked. She has never used smokeless  tobacco. She reports that she does not drink alcohol and does not use drugs.  Family History  Problem Relation Age of Onset   Osteoporosis Mother    Cancer Father        prostate, skin   Osteoporosis Father    Prostate cancer Father    Lung cancer Other    Breast cancer Neg Hx     The following portions of the patient's history were reviewed and updated as appropriate: allergies, current medications, past family history, past medical history, past social history, past surgical history and problem list.  Review of Systems Pertinent items noted in HPI and remainder of comprehensive ROS otherwise negative.  Physical Exam:  BP 109/73    Pulse (!) 118    Ht 5\' 2"  (1.575 m)    Wt 154 lb 14.4 oz (70.3 kg)    LMP 05/18/2021 (Exact Date)    BMI 28.33 kg/m   CONSTITUTIONAL: Well-developed, well-nourished female in no acute distress.  HENT:  Normocephalic, atraumatic, External right and left ear normal. Oropharynx is clear and moist EYES: Conjunctivae and EOM are normal. Pupils are equal, round, and reactive to light. No scleral icterus.  NECK: Normal range of motion, supple, no masses.  Normal thyroid.  SKIN: Skin is warm and dry. No rash noted. Not diaphoretic. No erythema. No pallor. MUSCULOSKELETAL: Normal range of motion. No tenderness.  No cyanosis, clubbing, or edema.  2+ distal pulses. NEUROLOGIC: Alert and oriented to person,  place, and time. Normal reflexes, muscle tone coordination.  PSYCHIATRIC: Normal mood and affect. Normal behavior. Normal judgment and thought content. CARDIOVASCULAR: Normal heart rate noted, regular rhythm RESPIRATORY: Clear to auscultation bilaterally. Effort and breath sounds normal, no problems with respiration noted. BREASTS: Symmetric in size. No masses, tenderness, skin changes, nipple drainage, or lymphadenopathy bilaterally.  ABDOMEN: Soft, no distention noted.  No tenderness, rebound or guarding.  PELVIC: Normal appearing external genitalia and  urethral meatus; normal appearing vaginal mucosa and cervix.  No abnormal discharge noted.  Pap smear obtained.  Normal uterine size, no other palpable masses, no uterine or adnexal tenderness.  .   Assessment and Plan:    1. Well woman exam with routine gynecological exam   Pap: not due Mammogram : ordered Labs: TSH, Lipid profile, Hem A1c  Refills: none Referral:none Routine preventative health maintenance measures emphasized. Please refer to After Visit Summary for other counseling recommendations.      Doreene Burke, CNM [facility],  [facility]

## 2021-06-16 LAB — LIPID PANEL
Chol/HDL Ratio: 3.1 ratio (ref 0.0–4.4)
Cholesterol, Total: 164 mg/dL (ref 100–199)
HDL: 53 mg/dL
LDL Chol Calc (NIH): 97 mg/dL (ref 0–99)
Triglycerides: 73 mg/dL (ref 0–149)
VLDL Cholesterol Cal: 14 mg/dL (ref 5–40)

## 2021-06-16 LAB — THYROID PANEL WITH TSH
Free Thyroxine Index: 1.5 (ref 1.2–4.9)
T3 Uptake Ratio: 25 % (ref 24–39)
T4, Total: 6.1 ug/dL (ref 4.5–12.0)
TSH: 0.592 u[IU]/mL (ref 0.450–4.500)

## 2021-06-16 LAB — HEMOGLOBIN A1C
Est. average glucose Bld gHb Est-mCnc: 97 mg/dL
Hgb A1c MFr Bld: 5 % (ref 4.8–5.6)

## 2021-07-22 ENCOUNTER — Encounter: Payer: Self-pay | Admitting: Obstetrics

## 2021-07-22 ENCOUNTER — Ambulatory Visit
Admission: RE | Admit: 2021-07-22 | Discharge: 2021-07-22 | Disposition: A | Discharge status: Home / Self Care | Payer: BC Managed Care – PPO | Source: Ambulatory Visit | Attending: Certified Nurse Midwife | Admitting: Certified Nurse Midwife

## 2021-07-22 ENCOUNTER — Other Ambulatory Visit: Payer: Self-pay

## 2021-07-22 DIAGNOSIS — Z01419 Encounter for gynecological examination (general) (routine) without abnormal findings: Secondary | ICD-10-CM | POA: Insufficient documentation

## 2021-07-22 DIAGNOSIS — Z1231 Encounter for screening mammogram for malignant neoplasm of breast: Secondary | ICD-10-CM | POA: Diagnosis not present

## 2022-06-20 ENCOUNTER — Other Ambulatory Visit: Payer: Self-pay | Admitting: Certified Nurse Midwife

## 2022-06-20 ENCOUNTER — Encounter: Payer: Self-pay | Admitting: Certified Nurse Midwife

## 2022-06-20 DIAGNOSIS — Z1231 Encounter for screening mammogram for malignant neoplasm of breast: Secondary | ICD-10-CM

## 2022-06-22 ENCOUNTER — Other Ambulatory Visit (HOSPITAL_COMMUNITY)
Admission: RE | Admit: 2022-06-22 | Discharge: 2022-06-22 | Disposition: A | Discharge status: Home / Self Care | Payer: BC Managed Care – PPO | Source: Ambulatory Visit | Attending: Certified Nurse Midwife | Admitting: Certified Nurse Midwife

## 2022-06-22 ENCOUNTER — Ambulatory Visit (INDEPENDENT_AMBULATORY_CARE_PROVIDER_SITE_OTHER): Payer: BC Managed Care – PPO | Admitting: Certified Nurse Midwife

## 2022-06-22 ENCOUNTER — Encounter: Payer: Self-pay | Admitting: Certified Nurse Midwife

## 2022-06-22 VITALS — BP 112/76 | HR 70 | Ht 62.0 in | Wt 131.0 lb

## 2022-06-22 DIAGNOSIS — E049 Nontoxic goiter, unspecified: Secondary | ICD-10-CM

## 2022-06-22 DIAGNOSIS — Z124 Encounter for screening for malignant neoplasm of cervix: Secondary | ICD-10-CM

## 2022-06-22 DIAGNOSIS — Z01419 Encounter for gynecological examination (general) (routine) without abnormal findings: Secondary | ICD-10-CM | POA: Diagnosis not present

## 2022-06-22 NOTE — Progress Notes (Signed)
GYNECOLOGY ANNUAL PREVENTATIVE CARE ENCOUNTER NOTE  History:     Rebekah Rivera is a 52 y.o. G0P0000 female here for a routine annual gynecologic exam.  Current complaints: has had a sinus infection.   Denies abnormal vaginal bleeding, discharge, pelvic pain, problems with intercourse or other gynecologic concerns.     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 Contraception: none Last Pap: 04/10/2018. Results were: normal with negative HPV Last mammogram: 07/22/21. Results were: normal  The pregnancy intention screening data noted above was reviewed. Potential methods of contraception were discussed. The patient elected to proceed with NFP.   Obstetric History OB History  Gravida Para Term Preterm AB Living  0 0 0 0 0 0  SAB IAB Ectopic Multiple Live Births  0 0 0 0 0    Past Medical History:  Diagnosis Date   GERD (gastroesophageal reflux disease)    Plantar fasciitis     History reviewed. No pertinent surgical history.  Current Outpatient Medications on File Prior to Visit  Medication Sig Dispense Refill   Ascorbic Acid (VITAMIN C) 1000 MG tablet Take 1,000 mg by mouth daily.     calcium carbonate (TUMS - DOSED IN MG ELEMENTAL CALCIUM) 500 MG chewable tablet Chew 1 tablet by mouth daily.     Multiple Vitamin (MULTIVITAMIN) tablet Take 1 tablet by mouth daily.     omeprazole (PRILOSEC) 20 MG capsule Take 20 mg by mouth daily. For two weeks as needed for heartburn.     Current Facility-Administered Medications on File Prior to Visit  Medication Dose Route Frequency Provider Last Rate Last Admin   betamethasone acetate-betamethasone sodium phosphate (CELESTONE) injection 3 mg  3 mg Intramuscular Once Daylene Katayama M, DPM       betamethasone acetate-betamethasone  sodium phosphate (CELESTONE) injection 3 mg  3 mg Intramuscular Once Edrick Kins, DPM        No Known Allergies  Social History:  reports that she has never smoked. She has never used smokeless tobacco. She reports that she does not drink alcohol and does not use drugs.  Family History  Problem Relation Age of Onset   Osteoporosis Mother    Cancer Father        prostate, skin   Osteoporosis Father    Prostate cancer Father    Dementia Father    Lung cancer Other    Breast cancer Neg Hx     The following portions of the patient's history were reviewed and updated as appropriate: allergies, current medications, past family history, past medical history, past social history, past surgical history and problem list.  Review of Systems Pertinent items noted in HPI and remainder of comprehensive ROS otherwise negative.  Physical Exam:  BP 112/76   Pulse 70   Ht 5\' 2"  (1.575 m)   Wt 131 lb (59.4 kg)   LMP 06/15/2022 (Exact Date)   BMI 23.96 kg/m  CONSTITUTIONAL: Well-developed, well-nourished female in no acute distress.  HENT:  Normocephalic, atraumatic, External right and left ear normal. Oropharynx is clear and moist EYES: Conjunctivae and EOM are normal. Pupils are equal, round, and reactive to light. No scleral icterus.  NECK: Normal range of motion, supple, no masses. Thyroid slight enlarged, lymph node enlarges ( suspect due to sinus infection).  SKIN:  Skin is warm and dry. No rash noted. Not diaphoretic. No erythema. No pallor. MUSCULOSKELETAL: Normal range of motion. No tenderness.  No cyanosis, clubbing, or edema.  2+ distal pulses. NEUROLOGIC: Alert and oriented to person, place, and time. Normal reflexes, muscle tone coordination.  PSYCHIATRIC: Normal mood and affect. Normal behavior. Normal judgment and thought content. CARDIOVASCULAR: Normal heart rate noted, regular rhythm RESPIRATORY: Clear to auscultation bilaterally. Effort and breath sounds normal, no problems  with respiration noted. BREASTS: Symmetric in size. No masses, tenderness, skin changes, nipple drainage, or lymphadenopathy bilaterally.  ABDOMEN: Soft, no distention noted.  No tenderness, rebound or guarding.  PELVIC: Normal appearing external genitalia and urethral meatus; normal appearing vaginal mucosa and cervix.  No abnormal discharge noted.  Pap smear obtained.  Normal uterine size, no other palpable masses, no uterine or adnexal tenderness.  .   Assessment and Plan:    1. Women's annual routine gynecological examination   Pap:  Will follow up results of pap smear and manage accordingly. Mammogram : scheduled for  07/25/22 Labs: TSH Refills: none Colonoscopy: declines this year, colo gaurd done 2021 Negative Referral: none Routine preventative health maintenance measures emphasized. Please refer to After Visit Summary for other counseling recommendations.      Philip Aspen, [facility]  [facility]

## 2022-06-23 LAB — TSH+FREE T4
Free T4: 1.25 ng/dL (ref 0.82–1.77)
TSH: 1.62 u[IU]/mL (ref 0.450–4.500)

## 2022-06-28 LAB — CYTOLOGY - PAP
Adequacy: ABSENT
Comment: NEGATIVE
Diagnosis: UNDETERMINED — AB
High risk HPV: NEGATIVE

## 2022-06-29 ENCOUNTER — Encounter: Payer: Self-pay | Admitting: Certified Nurse Midwife

## 2022-07-25 ENCOUNTER — Ambulatory Visit
Admission: RE | Admit: 2022-07-25 | Discharge: 2022-07-25 | Disposition: A | Discharge status: Home / Self Care | Payer: BC Managed Care – PPO | Source: Ambulatory Visit | Attending: Certified Nurse Midwife | Admitting: Certified Nurse Midwife

## 2022-07-25 DIAGNOSIS — Z1231 Encounter for screening mammogram for malignant neoplasm of breast: Secondary | ICD-10-CM | POA: Diagnosis not present

## 2022-11-02 DIAGNOSIS — C441122 Basal cell carcinoma of skin of right lower eyelid, including canthus: Secondary | ICD-10-CM | POA: Diagnosis not present

## 2022-11-02 DIAGNOSIS — C44519 Basal cell carcinoma of skin of other part of trunk: Secondary | ICD-10-CM | POA: Diagnosis not present

## 2022-11-02 DIAGNOSIS — D485 Neoplasm of uncertain behavior of skin: Secondary | ICD-10-CM | POA: Diagnosis not present

## 2022-11-02 DIAGNOSIS — D225 Melanocytic nevi of trunk: Secondary | ICD-10-CM | POA: Diagnosis not present

## 2022-11-02 DIAGNOSIS — D2262 Melanocytic nevi of left upper limb, including shoulder: Secondary | ICD-10-CM | POA: Diagnosis not present

## 2022-11-02 DIAGNOSIS — D2271 Melanocytic nevi of right lower limb, including hip: Secondary | ICD-10-CM | POA: Diagnosis not present

## 2022-11-02 DIAGNOSIS — D2261 Melanocytic nevi of right upper limb, including shoulder: Secondary | ICD-10-CM | POA: Diagnosis not present

## 2022-11-09 DIAGNOSIS — C441122 Basal cell carcinoma of skin of right lower eyelid, including canthus: Secondary | ICD-10-CM | POA: Insufficient documentation

## 2022-11-09 HISTORY — DX: Basal cell carcinoma of skin of right lower eyelid, including canthus: C44.1122

## 2022-12-16 DIAGNOSIS — C441122 Basal cell carcinoma of skin of right lower eyelid, including canthus: Secondary | ICD-10-CM | POA: Diagnosis not present

## 2023-01-18 DIAGNOSIS — C44519 Basal cell carcinoma of skin of other part of trunk: Secondary | ICD-10-CM | POA: Diagnosis not present

## 2023-02-09 DIAGNOSIS — C441122 Basal cell carcinoma of skin of right lower eyelid, including canthus: Secondary | ICD-10-CM | POA: Diagnosis not present

## 2023-02-09 DIAGNOSIS — L988 Other specified disorders of the skin and subcutaneous tissue: Secondary | ICD-10-CM | POA: Diagnosis not present

## 2023-02-09 DIAGNOSIS — L814 Other melanin hyperpigmentation: Secondary | ICD-10-CM | POA: Diagnosis not present

## 2023-02-09 DIAGNOSIS — L578 Other skin changes due to chronic exposure to nonionizing radiation: Secondary | ICD-10-CM | POA: Diagnosis not present

## 2023-02-10 DIAGNOSIS — C441122 Basal cell carcinoma of skin of right lower eyelid, including canthus: Secondary | ICD-10-CM | POA: Diagnosis not present

## 2023-03-30 ENCOUNTER — Encounter: Payer: Self-pay | Admitting: Pediatrics

## 2023-03-30 ENCOUNTER — Ambulatory Visit (INDEPENDENT_AMBULATORY_CARE_PROVIDER_SITE_OTHER): Payer: BC Managed Care – PPO | Admitting: Pediatrics

## 2023-03-30 VITALS — BP 114/73 | HR 60 | Temp 97.9°F | Ht 64.0 in | Wt 134.2 lb

## 2023-03-30 DIAGNOSIS — Z1211 Encounter for screening for malignant neoplasm of colon: Secondary | ICD-10-CM

## 2023-03-30 DIAGNOSIS — Z13220 Encounter for screening for lipoid disorders: Secondary | ICD-10-CM | POA: Diagnosis not present

## 2023-03-30 DIAGNOSIS — Z0000 Encounter for general adult medical examination without abnormal findings: Secondary | ICD-10-CM

## 2023-03-30 DIAGNOSIS — Z1330 Encounter for screening examination for mental health and behavioral disorders, unspecified: Secondary | ICD-10-CM

## 2023-03-30 DIAGNOSIS — Z131 Encounter for screening for diabetes mellitus: Secondary | ICD-10-CM | POA: Diagnosis not present

## 2023-03-30 DIAGNOSIS — Z7689 Persons encountering health services in other specified circumstances: Secondary | ICD-10-CM | POA: Diagnosis not present

## 2023-03-30 NOTE — Progress Notes (Deleted)
   Establish Care Note  BP 114/73   Pulse 60   Temp 97.9 F (36.6 C) (Oral)   Ht 5\' 4"  (1.626 m)   Wt 134 lb 3.2 oz (60.9 kg)   LMP 02/24/2023 (Exact Date)   SpO2 99%   BMI 23.04 kg/m    Subjective:    Patient ID: Talyiah Terpenning, female    DOB: 23-Mar-1971, 52 y.o.   MRN: [id number]  HPI: Lorean Tregre is a 52 y.o. female  Chief Complaint  Patient presents with   Establish Care    Establishing care, the following was discussed today:  Discussed the use of AI scribe software for clinical note transcription with the patient, who gave verbal consent to proceed.  History of Present Illness           Cologuard?    #HM Immunizations: {immunizationsmps:31081} Colon cancer screen: {mpsduenotdue:31082} ***Lung cancer screen: {mpsduenotdue:31082} ***Breast cancer screen: up to date; completed 07/25/22, due in 1-2 years ***Cervical cancer screen: {mpsduenotdue:31082} ***Prostrate cancer screen: {mpsduenotdue:31082}  Relevant past medical, surgical, family and social history reviewed and updated as indicated. Interim medical history since our last visit reviewed. Allergies and medications reviewed and updated.  ROS per HPI unless specifically indicated above     Objective:    BP 114/73   Pulse 60   Temp 97.9 F (36.6 C) (Oral)   Ht 5\' 4"  (1.626 m)   Wt 134 lb 3.2 oz (60.9 kg)   LMP 02/24/2023 (Exact Date)   SpO2 99%   BMI 23.04 kg/m   Wt Readings from Last 3 Encounters:  03/30/23 134 lb 3.2 oz (60.9 kg)  06/22/22 131 lb (59.4 kg)  06/15/21 154 lb 14.4 oz (70.3 kg)     Physical Exam      06/02/2017    9:24 AM  Depression screen PHQ 2/9  Decreased Interest 0  Down, Depressed, Hopeless 0  PHQ - 2 Score 0  Altered sleeping 0  Tired, decreased energy 0  Change in appetite 0  Feeling bad or failure about yourself  0  Trouble concentrating 0  Moving slowly or fidgety/restless 0  Suicidal thoughts 0  PHQ-9 Score 0  Difficult doing work/chores Not difficult at  all        Assessment & Plan:  Assessment & Plan   There are no diagnoses linked to this encounter.   Reviewed patient record including history, medications, problem list. Will schedule for physical***.   Follow up plan: No follow-ups on file.  Shelsie Tijerino Howell Pringle, MD

## 2023-03-30 NOTE — Progress Notes (Signed)
BP 114/73   Pulse 60   Temp 97.9 F (36.6 C) (Oral)   Ht 5\' 4"  (1.626 m)   Wt 134 lb 3.2 oz (60.9 kg)   LMP 02/24/2023 (Exact Date)   SpO2 99%   BMI 23.04 kg/m    Annual Physical Exam - Female Establish Care  Subjective:   CC: Establish Care   Rebekah Rivera is a 52 y.o. female patient here for a preventative health maintenance exam and has no acute complaints.  Health Habits: DIET: in general, a "healthy" diet  , no alcohol use EXERCISE: 5 times/week on average, activities include walking DENTAL EXAM: Up to Date EYE EXAM: Up to Date                       Relevant Gynecologic History LMP: Patient's last menstrual period was 02/24/2023 (exact date).  Menstrual Status: premenopausal, Flow flow is moderate and regular every month without intermenstrual spotting PAP History:  Result Date Procedure Results Follow-ups Next Due  06/22/2022 Cytology - PAP High risk HPV: Negative Adequacy: Satisfactory for evaluation; transformation zone component ABSENT. Diagnosis: - Atypical squamous cells of undetermined significance (ASC-US) Comment: Normal Reference Range HPV - Negative    04/10/2018 Cytology - PAP Adequacy: Satisfactory for evaluation  endocervical/transformation zone component ABSENT. Diagnosis: NEGATIVE FOR INTRAEPITHELIAL LESIONS OR MALIGNANCY. HPV: NOT DETECTED Material Submitted: CervicoVaginal Pap [ThinPrep Imaged]    01/27/2015 Cytology - PAP CYTOLOGY - PAP: PAP RESULT    11/14/2012 HM PAP SMEAR HM Pap smear: negative      History abnormal PAP: Yes  Sexual activity: single partner, contraception - none Family history breast, ovarian cancer: No Domestic Violence Screen, feels safe at home: Yes Husband  family history includes Cancer in her father; Dementia in her father; Lung cancer in an other family member; Osteoporosis in her father and mother; Prostate cancer in her father.  Social History   Tobacco Use   Smoking status: Never   Smokeless tobacco: Never   Vaping Use   Vaping status: Never Used  Substance Use Topics   Alcohol use: No   Drug use: No   Social History   Social History Narrative   Not on file    Social drivers questionnaire is reviewed and is positive for : None. Follow up: None  Depression Screening:     03/30/2023    8:42 AM 06/02/2017    9:24 AM  Depression screen PHQ 2/9  Decreased Interest 0 0  Down, Depressed, Hopeless 0 0  PHQ - 2 Score 0 0  Altered sleeping 0 0  Tired, decreased energy 0 0  Change in appetite 0 0  Feeling bad or failure about yourself  0 0  Trouble concentrating 0 0  Moving slowly or fidgety/restless 0 0  Suicidal thoughts 0 0  PHQ-9 Score 0 0  Difficult doing work/chores Not difficult at all Not difficult at all       03/30/2023    8:42 AM  GAD 7 : Generalized Anxiety Score  Nervous, Anxious, on Edge 0  Control/stop worrying 0  Worry too much - different things 0  Trouble relaxing 0  Restless 0  Easily annoyed or irritable 0  Afraid - awful might happen 0  Total GAD 7 Score 0  Anxiety Difficulty Not difficult at all    Mental Health Plan: no concerns  Self Management Goals  Goals   None     Health Maintenance Colon Cancer Screening : Up to  Date, due for cologuard in December (negative in 04/2020) Mammogram : Up to Date DXA scan : Not applicable Immunizations : up to date and documented  Review of Systems See HPI for relevant ROS.  Outpatient Medications Prior to Visit  Medication Sig Dispense Refill   Ascorbic Acid (VITAMIN C) 1000 MG tablet Take 1,000 mg by mouth daily.     calcium carbonate (TUMS - DOSED IN MG ELEMENTAL CALCIUM) 500 MG chewable tablet Chew 1 tablet by mouth daily. (Patient not taking: Reported on 03/30/2023)     Multiple Vitamin (MULTIVITAMIN) tablet Take 1 tablet by mouth daily. (Patient not taking: Reported on 03/30/2023)     omeprazole (PRILOSEC) 20 MG capsule Take 20 mg by mouth daily. For two weeks as needed for heartburn. (Patient not  taking: Reported on 03/30/2023)     Facility-Administered Medications Prior to Visit  Medication Dose Route Frequency Provider Last Rate Last Admin   betamethasone acetate-betamethasone sodium phosphate (CELESTONE) injection 3 mg  3 mg Intramuscular Once Gala Lewandowsky M, DPM       betamethasone acetate-betamethasone sodium phosphate (CELESTONE) injection 3 mg  3 mg Intramuscular Once Felecia Shelling, DPM         There are no problems to display for this patient.   Objective:   Vitals:   03/30/23 0833  BP: 114/73  Pulse: 60  Temp: 97.9 F (36.6 C)  Height: 5\' 4"  (1.626 m)  Weight: 134 lb 3.2 oz (60.9 kg)  SpO2: 99%  TempSrc: Oral  BMI (Calculated): 23.02    Body mass index is 23.04 kg/m.  Physical Exam Constitutional:      Appearance: Normal appearance.  HENT:     Head: Normocephalic and atraumatic.  Eyes:     Pupils: Pupils are equal, round, and reactive to light.  Cardiovascular:     Rate and Rhythm: Normal rate and regular rhythm.     Pulses: Normal pulses.     Heart sounds: Normal heart sounds.  Pulmonary:     Effort: Pulmonary effort is normal.     Breath sounds: Normal breath sounds.  Abdominal:     General: Abdomen is flat.     Palpations: Abdomen is soft.  Musculoskeletal:        General: Normal range of motion.     Cervical back: Normal range of motion.  Skin:    General: Skin is warm and dry.     Capillary Refill: Capillary refill takes less than 2 seconds.  Neurological:     General: No focal deficit present.     Mental Status: She is alert. Mental status is at baseline.  Psychiatric:        Mood and Affect: Mood normal.        Behavior: Behavior normal.    Assessment and Plan:   Annual physical exam Discussed lifestyle modifications and goals including plant based eating styles (such as: Mediterranean eating style), regular exercise (at least 150 min of moderate-intensity aerobic exercise per week, given AHA workout handout), get adequate  sleep, and continue working with PCP towards meeting health goals to ensure healthy aging.  -     Comprehensive metabolic panel  Diabetes mellitus screening -     Hemoglobin A1c  Lipid screening -     Lipid panel  Encounter to establish care Reviewed patient record including history, medications, problem list.   Screen for colon cancer -     Cologuard; Future  Encounter for behavioral health screening As part of their  intake evaluation, the patient was screened for depression, anxiety.  PHQ9 SCORE 0, GAD7 SCORE 0. Screening results negative for tested conditions.   This plan was discussed with the patient and questions were answered. There were no further concerns.  Follow up as indicated, or sooner should any new problems arise, if conditions worsen, or if they are otherwise concerned.   See patient instructions for additional information.  Rebekah Gallina Howell Pringle, MD  Family Medicine      No future appointments.

## 2023-03-30 NOTE — Patient Instructions (Signed)
Good to meet you! Welcome to [facility]!  As your primary care doctor, I look forward to working with you to help you reach your health goals.  Please be aware of a couple of logistical items: - If you message me on mychart, it may take me 1-2 business days to get back to you. This is for non-urgent messaging.  - If you require urgent clinical attention, please call the clinic or present to urgent care/emergency room - If you have labs, I typically will send a message about them in 1-2 business days. - I am not here on Mondays, otherwise will be available from Tuesday-Friday during 8a-5pm.

## 2023-03-31 LAB — COMPREHENSIVE METABOLIC PANEL
ALT: 16 [IU]/L (ref 0–32)
AST: 13 [IU]/L (ref 0–40)
Albumin: 4.4 g/dL (ref 3.8–4.9)
Alkaline Phosphatase: 90 [IU]/L (ref 44–121)
BUN/Creatinine Ratio: 27 — ABNORMAL HIGH (ref 9–23)
BUN: 17 mg/dL (ref 6–24)
Bilirubin Total: 0.4 mg/dL (ref 0.0–1.2)
CO2: 21 mmol/L (ref 20–29)
Calcium: 9.9 mg/dL (ref 8.7–10.2)
Chloride: 105 mmol/L (ref 96–106)
Creatinine, Ser: 0.63 mg/dL (ref 0.57–1.00)
Globulin, Total: 2.4 g/dL (ref 1.5–4.5)
Glucose: 87 mg/dL (ref 70–99)
Potassium: 4.5 mmol/L (ref 3.5–5.2)
Sodium: 140 mmol/L (ref 134–144)
Total Protein: 6.8 g/dL (ref 6.0–8.5)
eGFR: 105 mL/min/{1.73_m2} (ref >59)

## 2023-03-31 LAB — HEMOGLOBIN A1C
Est. average glucose Bld gHb Est-mCnc: 103 mg/dL
Hgb A1c MFr Bld: 5.2 % (ref 4.8–5.6)

## 2023-03-31 LAB — LIPID PANEL
Chol/HDL Ratio: 2.8 ratio (ref 0.0–4.4)
Cholesterol, Total: 185 mg/dL (ref 100–199)
HDL: 67 mg/dL (ref >39)
LDL Chol Calc (NIH): 110 mg/dL — ABNORMAL HIGH (ref 0–99)
Triglycerides: 42 mg/dL (ref 0–149)
VLDL Cholesterol Cal: 8 mg/dL (ref 5–40)

## 2023-05-12 DIAGNOSIS — Z1211 Encounter for screening for malignant neoplasm of colon: Secondary | ICD-10-CM

## 2023-05-18 DIAGNOSIS — Z1211 Encounter for screening for malignant neoplasm of colon: Secondary | ICD-10-CM | POA: Diagnosis not present

## 2023-05-25 LAB — COLOGUARD: COLOGUARD: NEGATIVE

## 2023-06-07 DIAGNOSIS — D2262 Melanocytic nevi of left upper limb, including shoulder: Secondary | ICD-10-CM | POA: Diagnosis not present

## 2023-06-07 DIAGNOSIS — D2272 Melanocytic nevi of left lower limb, including hip: Secondary | ICD-10-CM | POA: Diagnosis not present

## 2023-06-07 DIAGNOSIS — D225 Melanocytic nevi of trunk: Secondary | ICD-10-CM | POA: Diagnosis not present

## 2023-06-07 DIAGNOSIS — D2261 Melanocytic nevi of right upper limb, including shoulder: Secondary | ICD-10-CM | POA: Diagnosis not present

## 2023-06-27 ENCOUNTER — Ambulatory Visit (INDEPENDENT_AMBULATORY_CARE_PROVIDER_SITE_OTHER): Payer: BC Managed Care – PPO | Admitting: Certified Nurse Midwife

## 2023-06-27 ENCOUNTER — Encounter: Payer: Self-pay | Admitting: Certified Nurse Midwife

## 2023-06-27 VITALS — BP 121/73 | HR 75 | Ht 62.0 in | Wt 138.0 lb

## 2023-06-27 DIAGNOSIS — Z01419 Encounter for gynecological examination (general) (routine) without abnormal findings: Secondary | ICD-10-CM

## 2023-06-27 DIAGNOSIS — Z1231 Encounter for screening mammogram for malignant neoplasm of breast: Secondary | ICD-10-CM

## 2023-06-27 NOTE — Progress Notes (Signed)
GYNECOLOGY ANNUAL PREVENTATIVE CARE ENCOUNTER NOTE  History:     Rebekah Rivera is a 53 y.o. G0P0000 female here for a routine annual gynecologic exam.  Current complaints: none.   Denies abnormal vaginal bleeding, discharge, pelvic pain, problems with intercourse or other gynecologic concerns.     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 Smoke/Alcohol/drug use: denies use   Gynecologic History Patient's last menstrual period was 06/24/2023 (exact date). Contraception: none Last Pap: 06/22/2022. Results were: ascus with negative HPV Last mammogram: 07/27/2022. Results were: normal   The pregnancy intention screening data noted above was reviewed. Potential methods of contraception were discussed. The patient elected to proceed with No data recorded.  Obstetric History OB History  Gravida Para Term Preterm AB Living  0 0 0 0 0 0  SAB IAB Ectopic Multiple Live Births  0 0 0 0 0    Past Medical History:  Diagnosis Date   Basal cell carcinoma of right lower eyelid 11/09/2022   GERD (gastroesophageal reflux disease)    Plantar fasciitis     History reviewed. No pertinent surgical history.  Current Outpatient Medications on File Prior to Visit  Medication Sig Dispense Refill   Ascorbic Acid (VITAMIN C) 1000 MG tablet Take 1,000 mg by mouth daily. (Patient not taking: Reported on 06/27/2023)     Current Facility-Administered Medications on File Prior to Visit  Medication Dose Route Frequency Provider Last Rate Last Admin   betamethasone acetate-betamethasone sodium phosphate (CELESTONE) injection 3 mg  3 mg Intramuscular Once Gala Lewandowsky M, DPM       betamethasone acetate-betamethasone sodium phosphate (CELESTONE) injection 3 mg  3 mg Intramuscular Once Felecia Shelling, DPM        No Known Allergies  Social History:  reports that she has never smoked. She has never used  smokeless tobacco. She reports that she does not drink alcohol and does not use drugs.  Family History  Problem Relation Age of Onset   Osteoporosis Mother    Cancer Father        prostate, skin   Osteoporosis Father    Prostate cancer Father    Dementia Father    Lung cancer Other    Breast cancer Neg Hx     The following portions of the patient's history were reviewed and updated as appropriate: allergies, current medications, past family history, past medical history, past social history, past surgical history and problem list.  Review of Systems Pertinent items noted in HPI and remainder of comprehensive ROS otherwise negative.  Physical Exam:  BP 121/73   Pulse 75   Ht 5\' 2"  (1.575 m)   Wt 138 lb (62.6 kg)   LMP 06/24/2023 (Exact Date)   BMI 25.24 kg/m  CONSTITUTIONAL: Well-developed, well-nourished female in no acute distress.  HENT:  Normocephalic, atraumatic, External right and left ear normal. Oropharynx is clear and moist EYES: Conjunctivae and EOM are normal. Pupils are equal, round, and reactive to light. No scleral icterus.  NECK: Normal range of motion, supple, no masses.  Normal thyroid.  SKIN: Skin is warm and dry. No rash noted. Not diaphoretic. No erythema. No pallor. MUSCULOSKELETAL: Normal range of motion. No tenderness.  No cyanosis, clubbing, or edema.  2+ distal pulses. NEUROLOGIC: Alert and oriented to person, place, and time. Normal reflexes, muscle tone coordination.  PSYCHIATRIC: Normal mood and affect. Normal behavior. Normal judgment  and thought content. CARDIOVASCULAR: Normal heart rate noted, regular rhythm RESPIRATORY: Clear to auscultation bilaterally. Effort and breath sounds normal, no problems with respiration noted. BREASTS: Symmetric in size. No masses, tenderness, skin changes, nipple drainage, or lymphadenopathy bilaterally.  ABDOMEN: Soft, no distention noted.  No tenderness, rebound or guarding.  PELVIC: Normal appearing external  genitalia and urethral meatus; normal appearing vaginal mucosa and cervix.  No abnormal discharge noted.  Pap smear not due.  Normal uterine size, no other palpable masses, no uterine or adnexal tenderness.  .   Assessment and Plan:    1. Women's annual routine gynecological examination (Primary)   Pap: due 2027 Mammogram : ordered Labs: none due Refills: none  Referral: none Routine preventative health maintenance measures emphasized. Please refer to After Visit Summary for other counseling recommendations.      Doreene Burke, CNM [facility]  [facility],  [facility]

## 2023-06-27 NOTE — Patient Instructions (Signed)
Preventive Care 55-55 Years Old, Female  Preventive care refers to lifestyle choices and visits with your health care provider that can promote health and wellness. Preventive care visits are also called wellness exams.  What can I expect for my preventive care visit?  Counseling  Your health care provider may ask you questions about your:  Medical history, including:  Past medical problems.  Family medical history.  Pregnancy history.  Current health, including:  Menstrual cycle.  Method of birth control.  Emotional well-being.  Home life and relationship well-being.  Sexual activity and sexual health.  Lifestyle, including:  Alcohol, nicotine or tobacco, and drug use.  Access to firearms.  Diet, exercise, and sleep habits.  Work and work Astronomer.  Sunscreen use.  Safety issues such as seatbelt and bike helmet use.  Physical exam  Your health care provider will check your:  Height and weight. These may be used to calculate your BMI (body mass index). BMI is a measurement that tells if you are at a healthy weight.  Waist circumference. This measures the distance around your waistline. This measurement also tells if you are at a healthy weight and may help predict your risk of certain diseases, such as type 2 diabetes and high blood pressure.  Heart rate and blood pressure.  Body temperature.  Skin for abnormal spots.  What immunizations do I need?    Vaccines are usually given at various ages, according to a schedule. Your health care provider will recommend vaccines for you based on your age, medical history, and lifestyle or other factors, such as travel or where you work.  What tests do I need?  Screening  Your health care provider may recommend screening tests for certain conditions. This may include:  Lipid and cholesterol levels.  Diabetes screening. This is done by checking your blood sugar (glucose) after you have not eaten for a while (fasting).  Pelvic exam and Pap test.  Hepatitis B test.  Hepatitis C  test.  HIV (human immunodeficiency virus) test.  STI (sexually transmitted infection) testing, if you are at risk.  Lung cancer screening.  Colorectal cancer screening.  Mammogram. Talk with your health care provider about when you should start having regular mammograms. This may depend on whether you have a family history of breast cancer.  BRCA-related cancer screening. This may be done if you have a family history of breast, ovarian, tubal, or peritoneal cancers.  Bone density scan. This is done to screen for osteoporosis.  Talk with your health care provider about your test results, treatment options, and if necessary, the need for more tests.  Follow these instructions at home:  Eating and drinking    Eat a diet that includes fresh fruits and vegetables, whole grains, lean protein, and low-fat dairy products.  Take vitamin and mineral supplements as recommended by your health care provider.  Do not drink alcohol if:  Your health care provider tells you not to drink.  You are pregnant, may be pregnant, or are planning to become pregnant.  If you drink alcohol:  Limit how much you have to 0-1 drink a day.  Know how much alcohol is in your drink. In the [place] one drink equals one 12 oz bottle of beer (355 mL), one 5 oz glass of wine (148 mL), or one 1 oz glass of hard liquor (44 mL).  Lifestyle  Brush your teeth every morning and night with fluoride toothpaste. Floss one time each day.  Exercise for at least  30 minutes 5 or more days each week.  Do not use any products that contain nicotine or tobacco. These products include cigarettes, chewing tobacco, and vaping devices, such as e-cigarettes. If you need help quitting, ask your health care provider.  Do not use drugs.  If you are sexually active, practice safe sex. Use a condom or other form of protection to prevent STIs.  If you do not wish to become pregnant, use a form of birth control. If you plan to become pregnant, see your health care provider for a  prepregnancy visit.  Take aspirin only as told by your health care provider. Make sure that you understand how much to take and what form to take. Work with your health care provider to find out whether it is safe and beneficial for you to take aspirin daily.  Find healthy ways to manage stress, such as:  Meditation, yoga, or listening to music.  Journaling.  Talking to a trusted person.  Spending time with friends and family.  Minimize exposure to UV radiation to reduce your risk of skin cancer.  Safety  Always wear your seat belt while driving or riding in a vehicle.  Do not drive:  If you have been drinking alcohol. Do not ride with someone who has been drinking.  When you are tired or distracted.  While texting.  If you have been using any mind-altering substances or drugs.  Wear a helmet and other protective equipment during sports activities.  If you have firearms in your house, make sure you follow all gun safety procedures.  Seek help if you have been physically or sexually abused.  What's next?  Visit your health care provider once a year for an annual wellness visit.  Ask your health care provider how often you should have your eyes and teeth checked.  Stay up to date on all vaccines.  This information is not intended to replace advice given to you by your health care provider. Make sure you discuss any questions you have with your health care provider.  Document Revised: 11/11/2020 Document Reviewed: 11/11/2020  Elsevier Patient Education  2024 [employer].

## 2023-07-27 ENCOUNTER — Ambulatory Visit
Admission: RE | Admit: 2023-07-27 | Discharge: 2023-07-27 | Disposition: A | Discharge status: Home / Self Care | Payer: BC Managed Care – PPO | Source: Ambulatory Visit | Attending: Certified Nurse Midwife | Admitting: Certified Nurse Midwife

## 2023-07-27 DIAGNOSIS — Z1231 Encounter for screening mammogram for malignant neoplasm of breast: Secondary | ICD-10-CM | POA: Insufficient documentation

## 2023-07-27 DIAGNOSIS — Z01419 Encounter for gynecological examination (general) (routine) without abnormal findings: Secondary | ICD-10-CM | POA: Insufficient documentation

## 2023-09-13 ENCOUNTER — Encounter: Payer: Self-pay | Admitting: Pediatrics

## 2023-09-13 ENCOUNTER — Ambulatory Visit (INDEPENDENT_AMBULATORY_CARE_PROVIDER_SITE_OTHER): Admitting: Pediatrics

## 2023-09-13 VITALS — BP 116/69 | HR 69 | Temp 98.6°F | Wt 139.0 lb

## 2023-09-13 DIAGNOSIS — J019 Acute sinusitis, unspecified: Secondary | ICD-10-CM | POA: Diagnosis not present

## 2023-09-13 DIAGNOSIS — B9689 Other specified bacterial agents as the cause of diseases classified elsewhere: Secondary | ICD-10-CM

## 2023-09-13 MED ORDER — AMOXICILLIN 500 MG PO CAPS: 500.0000 mg | ORAL_CAPSULE | Freq: Two times a day (BID) | ORAL | 0 refills | Status: AC

## 2023-09-13 NOTE — Progress Notes (Signed)
 Office Visit  BP 116/69   Pulse 69   Temp 98.6 F (37 C) (Oral)   Wt 139 lb (63 kg)   LMP 08/09/2023 (Approximate)   SpO2 99%   BMI 25.42 kg/m    Subjective:    Patient ID: Rebekah Rivera, female    DOB: 1970/10/24, 53 y.o.   MRN: [id number]  HPI: Rebekah Rivera is a 53 y.o. female  Chief Complaint  Patient presents with   Sinusitis    Pt states has been going on about 3 weeks     Discussed the use of AI scribe software for clinical note transcription with the patient, who gave verbal consent to proceed.  History of Present Illness   Rebekah Rivera is a 53 year old female who presents with persistent jaw and sinus pain.  Approximately three weeks ago, she bit down on a walnut shell during dinner, resulting in significant pain in her teeth and jaw. The pain did not improve over time, prompting her to visit the dentist, where she was informed of a cracked filling and is scheduled for a crown placement.  The pain initially localized in her jaw and has since moved, suggesting involvement of her sinuses. She describes the sensation as similar to a sinus infection, which she has experienced before but typically manages at home with heat and liquids. However, this time, the symptoms have persisted beyond her usual self-care measures.  She has experienced significant nasal congestion but no green or yellow mucus. She has tried over-the-counter decongestants but avoids them due to their stimulating effects, which disrupt her sleep. She has also used heat, herbal tea, steam, and Nuvessa without relief.  No fever is present, and the pain is primarily in her upper teeth, though it has also affected her lower teeth. She has no known medication allergies and has not taken antibiotics recently. No other family members are experiencing similar symptoms.  Significant pain is noted upon palpation of the sinus areas, particularly in the upper regions, but no fever. No relief from over-the-counter  medications like Zyrtec.       Relevant past medical, surgical, family and social history reviewed and updated as indicated. Interim medical history since our last visit reviewed. Allergies and medications reviewed and updated.  ROS per HPI unless specifically indicated above     Objective:    BP 116/69   Pulse 69   Temp 98.6 F (37 C) (Oral)   Wt 139 lb (63 kg)   LMP 08/09/2023 (Approximate)   SpO2 99%   BMI 25.42 kg/m   Wt Readings from Last 3 Encounters:  09/13/23 139 lb (63 kg)  06/27/23 138 lb (62.6 kg)  03/30/23 134 lb 3.2 oz (60.9 kg)     Physical Exam Constitutional:      Appearance: Normal appearance.  HENT:     Head: Normocephalic and atraumatic.     Nose:     Right Turbinates: Enlarged.     Left Turbinates: Enlarged.     Right Sinus: Maxillary sinus tenderness present. No frontal sinus tenderness.     Left Sinus: Maxillary sinus tenderness present. No frontal sinus tenderness.  Eyes:     Pupils: Pupils are equal, round, and reactive to light.  Cardiovascular:     Rate and Rhythm: Normal rate and regular rhythm.     Pulses: Normal pulses.     Heart sounds: Normal heart sounds.  Pulmonary:     Effort: Pulmonary effort is normal.     Breath sounds: Normal  breath sounds.  Musculoskeletal:        General: Normal range of motion.     Cervical back: Normal range of motion.  Skin:    General: Skin is warm and dry.     Capillary Refill: Capillary refill takes less than 2 seconds.  Neurological:     General: No focal deficit present.     Mental Status: She is alert. Mental status is at baseline.  Psychiatric:        Mood and Affect: Mood normal.        Behavior: Behavior normal.         09/13/2023    4:22 PM 03/30/2023    8:42 AM 06/02/2017    9:24 AM  Depression screen PHQ 2/9  Decreased Interest 0 0 0  Down, Depressed, Hopeless 0 0 0  PHQ - 2 Score 0 0 0  Altered sleeping 0 0 0  Tired, decreased energy 0 0 0  Change in appetite 0 0 0  Feeling  bad or failure about yourself  0 0 0  Trouble concentrating 0 0 0  Moving slowly or fidgety/restless 0 0 0  Suicidal thoughts 0 0 0  PHQ-9 Score 0 0 0  Difficult doing work/chores Not difficult at all Not difficult at all Not difficult at all       09/13/2023    4:22 PM 03/30/2023    8:42 AM  GAD 7 : Generalized Anxiety Score  Nervous, Anxious, on Edge 0 0  Control/stop worrying 0 0  Worry too much - different things 0 0  Trouble relaxing 0 0  Restless 0 0  Easily annoyed or irritable 0 0  Afraid - awful might happen 0 0  Total GAD 7 Score 0 0  Anxiety Difficulty Not difficult at all Not difficult at all       Assessment & Plan:  Assessment & Plan   Acute bacterial sinusitis Symptoms persisting beyond typical viral duration suggest bacterial etiology. OTC treatments ineffective. Antibiotics indicated to prevent prolonged symptoms and worsening. - Prescribed antibiotics for 10 days, twice daily. - Advised to report if no improvement after 5 days. - No immediate follow-up unless symptoms persist or worsen.  -     Amoxicillin ; Take 1 capsule (500 mg total) by mouth 2 (two) times daily for 10 days.  Dispense: 20 capsule; Refill: 0  Follow up plan: Return if symptoms worsen or fail to improve.  Hadassah Letters, MD

## 2023-09-13 NOTE — Patient Instructions (Signed)
 Amoxicillin 500 mg TWICE daily for 10 days

## 2023-09-24 ENCOUNTER — Encounter: Payer: Self-pay | Admitting: Pediatrics

## 2024-02-14 DIAGNOSIS — D2272 Melanocytic nevi of left lower limb, including hip: Secondary | ICD-10-CM | POA: Diagnosis not present

## 2024-02-14 DIAGNOSIS — D2261 Melanocytic nevi of right upper limb, including shoulder: Secondary | ICD-10-CM | POA: Diagnosis not present

## 2024-02-14 DIAGNOSIS — D2262 Melanocytic nevi of left upper limb, including shoulder: Secondary | ICD-10-CM | POA: Diagnosis not present

## 2024-02-14 DIAGNOSIS — D225 Melanocytic nevi of trunk: Secondary | ICD-10-CM | POA: Diagnosis not present

## 2024-02-14 DIAGNOSIS — L570 Actinic keratosis: Secondary | ICD-10-CM | POA: Diagnosis not present

## 2024-04-11 ENCOUNTER — Ambulatory Visit (INDEPENDENT_AMBULATORY_CARE_PROVIDER_SITE_OTHER): Payer: Self-pay | Admitting: Pediatrics

## 2024-04-11 ENCOUNTER — Encounter: Payer: BC Managed Care – PPO | Admitting: Pediatrics

## 2024-04-11 ENCOUNTER — Encounter: Payer: Self-pay | Admitting: Pediatrics

## 2024-04-11 VITALS — BP 121/74 | HR 62 | Temp 98.4°F

## 2024-04-11 DIAGNOSIS — Z0000 Encounter for general adult medical examination without abnormal findings: Secondary | ICD-10-CM

## 2024-04-11 DIAGNOSIS — Z1330 Encounter for screening examination for mental health and behavioral disorders, unspecified: Secondary | ICD-10-CM | POA: Diagnosis not present

## 2024-04-11 NOTE — Progress Notes (Signed)
 BP 121/74   Pulse 62   Temp 98.4 F (36.9 C) (Oral)   LMP 03/12/2024 (Exact Date)   SpO2 100%    Annual Physical Exam - Female  Subjective:   CC: Annual Exam   Rebekah Rivera is a 53 y.o. female patient here for a preventative health maintenance exam and has no acute complaints.  Health Habits: DIET: in general, a healthy diet   EXERCISE:active   Relevant Gynecologic History LMP: Patient's last menstrual period was 03/12/2024 (exact date).  Menstrual Status: premenopausal, Flow regular every month without intermenstrual spotting PAP History:  Result Date Procedure Results Follow-ups  06/22/2022 Cytology - PAP High risk HPV: Negative Adequacy: Satisfactory for evaluation; transformation zone component ABSENT. Diagnosis: - Atypical squamous cells of undetermined significance (ASC-US ) (A) Comment: Normal Reference Range HPV - Negative   04/10/2018 Cytology - PAP Adequacy: Satisfactory for evaluation  endocervical/transformation zone component ABSENT. Diagnosis: NEGATIVE FOR INTRAEPITHELIAL LESIONS OR MALIGNANCY. HPV: NOT DETECTED Material Submitted: CervicoVaginal Pap [ThinPrep Imaged]   01/27/2015 Cytology - PAP CYTOLOGY - PAP: PAP RESULT   11/14/2012 HM PAP SMEAR HM Pap smear: negative     History abnormal PAP: yes, she is due for repeat in 3 years due to most recent ASCUS finding   Family history breast, ovarian cancer: No Domestic Violence Screen, feels safe at home: Yes  family history includes Cancer in her father; Dementia in her father; Lung cancer in an other family member; Osteoporosis in her father and mother; Prostate cancer in her father.  Social History   Tobacco Use   Smoking status: Never   Smokeless tobacco: Never  Vaping Use   Vaping status: Never Used  Substance Use Topics   Alcohol use: No   Drug use: No   Social History   Social History Narrative   Not on file    Social drivers questionnaire is reviewed and is positive for:  none  Depression Screening:     09/13/2023    4:22 PM 03/30/2023    8:42 AM 06/02/2017    9:24 AM  Depression screen PHQ 2/9  Decreased Interest 0 0 0  Down, Depressed, Hopeless 0 0 0  PHQ - 2 Score 0 0 0  Altered sleeping 0 0 0  Tired, decreased energy 0 0 0  Change in appetite 0 0 0  Feeling bad or failure about yourself  0 0 0  Trouble concentrating 0 0 0  Moving slowly or fidgety/restless 0 0 0  Suicidal thoughts 0 0 0  PHQ-9 Score 0  0  0   Difficult doing work/chores Not difficult at all Not difficult at all Not difficult at all     Data saved with a previous flowsheet row definition       09/13/2023    4:22 PM 03/30/2023    8:42 AM  GAD 7 : Generalized Anxiety Score  Nervous, Anxious, on Edge 0 0  Control/stop worrying 0 0  Worry too much - different things 0 0  Trouble relaxing 0 0  Restless 0 0  Easily annoyed or irritable 0 0  Afraid - awful might happen 0 0  Total GAD 7 Score 0 0  Anxiety Difficulty Not difficult at all Not difficult at all    Mental Health Plan: CTM  Self Management Goals  Goals   None     Health Maintenance Colon Cancer Screening : Up to Date Mammogram : Up to Date DXA scan : Not applicable Immunizations : up to date  and documented  Review of Systems See HPI for relevant ROS.  Outpatient Medications Prior to Visit  Medication Sig Dispense Refill   Ascorbic Acid (VITAMIN C) 1000 MG tablet Take 1,000 mg by mouth daily.     Facility-Administered Medications Prior to Visit  Medication Dose Route Frequency Provider Last Rate Last Admin   betamethasone  acetate-betamethasone  sodium phosphate (CELESTONE ) injection 3 mg  3 mg Intramuscular Once Evans, Brent M, DPM       betamethasone  acetate-betamethasone  sodium phosphate (CELESTONE ) injection 3 mg  3 mg Intramuscular Once Evans, Brent M, DPM         There are no active problems to display for this patient.   Objective:   Vitals:   04/11/24 0944  BP: 121/74  Pulse: 62   Temp: 98.4 F (36.9 C)  SpO2: 100%  TempSrc: Oral    There is no height or weight on file to calculate BMI.  Physical Exam Constitutional:      Appearance: Normal appearance.  HENT:     Head: Normocephalic and atraumatic.  Eyes:     Pupils: Pupils are equal, round, and reactive to light.  Cardiovascular:     Rate and Rhythm: Normal rate and regular rhythm.     Pulses: Normal pulses.     Heart sounds: Normal heart sounds.  Pulmonary:     Effort: Pulmonary effort is normal.     Breath sounds: Normal breath sounds.  Abdominal:     General: Abdomen is flat.     Palpations: Abdomen is soft.  Musculoskeletal:        General: Normal range of motion.     Cervical back: Normal range of motion.  Skin:    General: Skin is warm and dry.     Capillary Refill: Capillary refill takes less than 2 seconds.  Neurological:     General: No focal deficit present.     Mental Status: She is alert. Mental status is at baseline.  Psychiatric:        Mood and Affect: Mood normal.        Behavior: Behavior normal.      Assessment and Plan:   Annual physical exam Discussed lifestyle modifications and goals including plant based eating styles (such as: Mediterranean eating style), regular exercise (at least 150 min of moderate-intensity aerobic exercise per week, given AHA workout handout), get adequate sleep, and continue working with PCP towards meeting health goals to ensure healthy aging.  -     Hemoglobin A1c -     Lipid panel -     Comprehensive metabolic panel with GFR  Encounter for behavioral health screening As part of their intake evaluation, the patient was screened for depression, anxiety.  PHQ9 SCORE 0, GAD7 SCORE 0. Screening results negative for tested conditions. See plan under problem/diagnosis above.   This plan was discussed with the patient and questions were answered. There were no further concerns.  Follow up as indicated, or sooner should any new problems arise,  if conditions worsen, or if they are otherwise concerned.   See patient instructions for additional information.  Hadassah SHAUNNA Nett, MD  Family Medicine      Future Appointments  Date Time Provider Department Center  04/14/2025 10:00 AM Melvin Pao, NP [facility] [address]

## 2024-04-12 LAB — LIPID PANEL
Chol/HDL Ratio: 3 ratio (ref 0.0–4.4)
Cholesterol, Total: 204 mg/dL — ABNORMAL HIGH (ref 100–199)
HDL: 67 mg/dL (ref >39)
LDL Chol Calc (NIH): 129 mg/dL — ABNORMAL HIGH (ref 0–99)
Triglycerides: 41 mg/dL (ref 0–149)
VLDL Cholesterol Cal: 8 mg/dL (ref 5–40)

## 2024-04-12 LAB — COMPREHENSIVE METABOLIC PANEL WITH GFR
ALT: 14 IU/L (ref 0–32)
AST: 14 IU/L (ref 0–40)
Albumin: 4.3 g/dL (ref 3.8–4.9)
Alkaline Phosphatase: 80 IU/L (ref 49–135)
BUN/Creatinine Ratio: 25 — ABNORMAL HIGH (ref 9–23)
BUN: 16 mg/dL (ref 6–24)
Bilirubin Total: 0.5 mg/dL (ref 0.0–1.2)
CO2: 23 mmol/L (ref 20–29)
Calcium: 9.6 mg/dL (ref 8.7–10.2)
Chloride: 105 mmol/L (ref 96–106)
Creatinine, Ser: 0.63 mg/dL (ref 0.57–1.00)
Globulin, Total: 2.6 g/dL (ref 1.5–4.5)
Glucose: 90 mg/dL (ref 70–99)
Potassium: 4.2 mmol/L (ref 3.5–5.2)
Sodium: 142 mmol/L (ref 134–144)
Total Protein: 6.9 g/dL (ref 6.0–8.5)
eGFR: 105 mL/min/1.73 (ref >59)

## 2024-04-12 LAB — HEMOGLOBIN A1C
Est. average glucose Bld gHb Est-mCnc: 97 mg/dL
Hgb A1c MFr Bld: 5 % (ref 4.8–5.6)

## 2024-04-16 ENCOUNTER — Ambulatory Visit: Payer: Self-pay | Admitting: Pediatrics

## 2024-04-17 ENCOUNTER — Encounter: Payer: Self-pay | Admitting: Pediatrics

## 2024-04-19 ENCOUNTER — Other Ambulatory Visit: Payer: Self-pay | Admitting: Pediatrics

## 2024-04-19 DIAGNOSIS — Z1159 Encounter for screening for other viral diseases: Secondary | ICD-10-CM

## 2024-04-19 NOTE — Progress Notes (Signed)
 Hep b immunity lab order  Rebekah SHAUNNA Nett, MD

## 2024-04-19 NOTE — Telephone Encounter (Signed)
 Scheduled

## 2024-04-22 ENCOUNTER — Other Ambulatory Visit

## 2024-04-22 DIAGNOSIS — Z1159 Encounter for screening for other viral diseases: Secondary | ICD-10-CM | POA: Diagnosis not present

## 2024-04-23 LAB — HEPATITIS B SURFACE ANTIBODY, QUANTITATIVE: Hepatitis B Surf Ab Quant: <3.5 m[IU]/mL — ABNORMAL LOW

## 2024-04-24 ENCOUNTER — Ambulatory Visit: Payer: Self-pay | Admitting: Pediatrics

## 2024-04-30 ENCOUNTER — Ambulatory Visit (INDEPENDENT_AMBULATORY_CARE_PROVIDER_SITE_OTHER)

## 2024-04-30 DIAGNOSIS — Z23 Encounter for immunization: Secondary | ICD-10-CM | POA: Diagnosis not present

## 2024-04-30 NOTE — Progress Notes (Signed)
 Patient is in office today for a nurse visit for Immunization. Patient Injection was given in the  Left deltoid. Patient tolerated injection well.

## 2024-06-04 ENCOUNTER — Other Ambulatory Visit: Payer: Self-pay | Admitting: Certified Nurse Midwife

## 2024-06-04 DIAGNOSIS — Z1231 Encounter for screening mammogram for malignant neoplasm of breast: Secondary | ICD-10-CM

## 2024-06-27 ENCOUNTER — Encounter: Payer: Self-pay | Admitting: Certified Nurse Midwife

## 2024-06-27 ENCOUNTER — Other Ambulatory Visit (HOSPITAL_COMMUNITY)
Admission: RE | Admit: 2024-06-27 | Discharge: 2024-06-27 | Disposition: A | Source: Ambulatory Visit | Attending: Certified Nurse Midwife | Admitting: Certified Nurse Midwife

## 2024-06-27 ENCOUNTER — Ambulatory Visit: Admitting: Certified Nurse Midwife

## 2024-06-27 VITALS — BP 114/64 | HR 66 | Ht 62.0 in | Wt 142.5 lb

## 2024-06-27 DIAGNOSIS — Z124 Encounter for screening for malignant neoplasm of cervix: Secondary | ICD-10-CM | POA: Diagnosis present

## 2024-06-27 DIAGNOSIS — Z1231 Encounter for screening mammogram for malignant neoplasm of breast: Secondary | ICD-10-CM

## 2024-06-27 DIAGNOSIS — Z01419 Encounter for gynecological examination (general) (routine) without abnormal findings: Secondary | ICD-10-CM

## 2024-06-27 NOTE — Addendum Note (Signed)
 Addended by: DELANA SAUER on: 06/27/2024 02:51 PM   Modules accepted: Orders

## 2024-06-27 NOTE — Patient Instructions (Signed)
 Preventive Care 56-56 Years Old, Female Preventive care refers to lifestyle choices and visits with your health care provider that can promote health and wellness. Preventive care visits are also called wellness exams. What can I expect for my preventive care visit? Counseling Your health care provider may ask you questions about your: Medical history, including: Past medical problems. Family medical history. Pregnancy history. Current health, including: Menstrual cycle. Method of birth control. Emotional well-being. Home life and relationship well-being. Sexual activity and sexual health. Lifestyle, including: Alcohol, nicotine or tobacco, and drug use. Access to firearms. Diet, exercise, and sleep habits. Work and work Astronomer. Sunscreen use. Safety issues such as seatbelt and bike helmet use. Physical exam Your health care provider will check your: Height and weight. These may be used to calculate your BMI (body mass index). BMI is a measurement that tells if you are at a healthy weight. Waist circumference. This measures the distance around your waistline. This measurement also tells if you are at a healthy weight and may help predict your risk of certain diseases, such as type 2 diabetes and high blood pressure. Heart rate and blood pressure. Body temperature. Skin for abnormal spots. What immunizations do I need?  Vaccines are usually given at various ages, according to a schedule. Your health care provider will recommend vaccines for you based on your age, medical history, and lifestyle or other factors, such as travel or where you work. What tests do I need? Screening Your health care provider may recommend screening tests for certain conditions. This may include: Lipid and cholesterol levels. Diabetes screening. This is done by checking your blood sugar (glucose) after you have not eaten for a while (fasting). Pelvic exam and Pap test. Hepatitis B test. Hepatitis C  test. HIV (human immunodeficiency virus) test. STI (sexually transmitted infection) testing, if you are at risk. Lung cancer screening. Colorectal cancer screening. Mammogram. Talk with your health care provider about when you should start having regular mammograms. This may depend on whether you have a family history of breast cancer. BRCA-related cancer screening. This may be done if you have a family history of breast, ovarian, tubal, or peritoneal cancers. Bone density scan. This is done to screen for osteoporosis. Talk with your health care provider about your test results, treatment options, and if necessary, the need for more tests. Follow these instructions at home: Eating and drinking  Eat a diet that includes fresh fruits and vegetables, whole grains, lean protein, and low-fat dairy products. Take vitamin and mineral supplements as recommended by your health care provider. Do not drink alcohol if: Your health care provider tells you not to drink. You are pregnant, may be pregnant, or are planning to become pregnant. If you drink alcohol: Limit how much you have to 0-1 drink a day. Know how much alcohol is in your drink. In the [place] one drink equals one 12 oz bottle of beer (355 mL), one 5 oz glass of wine (148 mL), or one 1 oz glass of hard liquor (44 mL). Lifestyle Brush your teeth every morning and night with fluoride toothpaste. Floss one time each day. Exercise for at least 30 minutes 5 or more days each week. Do not use any products that contain nicotine or tobacco. These products include cigarettes, chewing tobacco, and vaping devices, such as e-cigarettes. If you need help quitting, ask your health care provider. Do not use drugs. If you are sexually active, practice safe sex. Use a condom or other form of protection to  prevent STIs. If you do not wish to become pregnant, use a form of birth control. If you plan to become pregnant, see your health care provider for a  prepregnancy visit. Take aspirin only as told by your health care provider. Make sure that you understand how much to take and what form to take. Work with your health care provider to find out whether it is safe and beneficial for you to take aspirin daily. Find healthy ways to manage stress, such as: Meditation, yoga, or listening to music. Journaling. Talking to a trusted person. Spending time with friends and family. Minimize exposure to UV radiation to reduce your risk of skin cancer. Safety Always wear your seat belt while driving or riding in a vehicle. Do not drive: If you have been drinking alcohol. Do not ride with someone who has been drinking. When you are tired or distracted. While texting. If you have been using any mind-altering substances or drugs. Wear a helmet and other protective equipment during sports activities. If you have firearms in your house, make sure you follow all gun safety procedures. Seek help if you have been physically or sexually abused. What's next? Visit your health care provider once a year for an annual wellness visit. Ask your health care provider how often you should have your eyes and teeth checked. Stay up to date on all vaccines. This information is not intended to replace advice given to you by your health care provider. Make sure you discuss any questions you have with your health care provider. Document Revised: 11/11/2020 Document Reviewed: 11/11/2020 Elsevier Patient Education  2024 [employer] How to Do a Breast Self-Exam Doing breast self-exams can help you stay healthy. They're one way to know what's normal for your breasts. They can help you catch a problem while it's still small and can be treated. You need to: Check your breasts often. Tell your doctor about any changes. You should do breast self-exams even if you have breast implants. What you need: A mirror. A well-lit room. A pillow or other soft object. How to do a  breast self-exam Look for changes  Take off all the clothes above your waist. Stand in front of a mirror in a room with good lighting. Put your hands down at your sides. Compare your breasts in the mirror. Look for difference between them, such as: Differences in shape. Differences in size. Wrinkles, dips, and bumps in one breast and not the other. Look at each breast for skin changes, such as: Redness. Scaly spots. Spots where your skin is thicker. Dimpling. Open sores. Look for changes in your nipples, such as: Fluid coming out of a nipple. Fluid around a nipple. Bleeding. Dimpling. Redness. A nipple that looks pushed in or that has changed position. Feel for changes Lie on your back. Feel each breast. To do this: Pick a breast to feel. Place a pillow under the shoulder closest to that breast. Put the arm closest to that breast behind your head. Feel the breast using the hand of your other arm. Use the pads of your three middle fingers to make small circles starting near the nipple. Use light, medium, and firm pressure. Keep making circles, moving down over the breast. Stop when you feel your ribs. Start making circles with your fingers again, this time going up until you reach your collarbone. Then, make circles out across your breast and into your armpit area. Squeeze your nipple. Check for fluid and lumps. Do these steps again  to check your other breast. Sit or stand in the tub or shower. With soapy water on your skin, feel each breast the same way you did when you were lying down. Write down what you find Writing down what you find can help you keep track of what you want to tell your doctor. Write down: What's normal for each breast. Any changes you find. Write down: The kind of change. If your breast feels tender or painful. Any lump you find. Write down its size and where it is. When you last had your period. General tips If you're breastfeeding, the best time  to check your breasts is after you feed your baby or after you use a breast pump. If you get a period, the best time to check your breasts is 5-7 days after your period ends. With time, you'll get more used to doing the self-exam. You'll also start to know if there are changes in your breasts. Contact a doctor if: You see a change in the shape or size of your breasts or nipples. You see a change in the skin of your breast or nipples. You have fluid coming from your nipples that isn't normal. You find a new lump or thick area. You have breast pain. You have any concerns about your breast health. This information is not intended to replace advice given to you by your health care provider. Make sure you discuss any questions you have with your health care provider. Document Revised: 07/26/2023 Document Reviewed: 07/26/2023 Elsevier Patient Education  2025 [employer].

## 2024-06-27 NOTE — Progress Notes (Signed)
 "        GYNECOLOGY ANNUAL PREVENTATIVE CARE ENCOUNTER NOTE  History:     Vickey Boak is a 54 y.o. G0P0000 female here for a routine annual gynecologic exam.  Current complaints: none, feels well today.  She states she has not had a period since October and that one was very light. Prior to that she was getting regular periods. She denies an menopausal symptoms that are bothersome to her.  Denies abnormal vaginal bleeding, discharge, pelvic pain, problems with intercourse or other gynecologic concerns.     Social Relationship:married Living:husband Work:unemployed Exercise:walking Smoke/Alcohol/drug use:No/Never/ No  Gynecologic History Patient's last menstrual period was 03/12/2024 (within months). Contraception: none Last Pap: 06/22/22. Results were: normal with negative HPV Last mammogram: 07/27/23. Results were: normal  Obstetric History OB History  Gravida Para Term Preterm AB Living  0 0 0 0 0 0  SAB IAB Ectopic Multiple Live Births  0 0 0 0 0    Past Medical History:  Diagnosis Date   Basal cell carcinoma of right lower eyelid 11/09/2022   GERD (gastroesophageal reflux disease)    Plantar fasciitis     History reviewed. No pertinent surgical history.  Medications Ordered Prior to Encounter[1]  Allergies[2]  Social History:  reports that she has never smoked. She has never used smokeless tobacco. She reports that she does not drink alcohol and does not use drugs.  Family History  Problem Relation Age of Onset   Osteoporosis Mother    Cancer Father        prostate, skin   Osteoporosis Father    Prostate cancer Father    Dementia Father    Lung cancer Other    Breast cancer Neg Hx     The following portions of the patient's history were reviewed and updated as appropriate: allergies, current medications, past family history, past medical history, past social history, past surgical history and problem list.  Review of Systems Pertinent items noted in  HPI and remainder of comprehensive ROS otherwise negative.  Physical Exam:  BP 114/64   Pulse 66   Ht 5' 2 (1.575 m)   Wt 142 lb 8 oz (64.6 kg)   LMP 03/12/2024 (Within Months) Comment: patient reports lmp 03/12/2024  BMI 26.06 kg/m  CONSTITUTIONAL: Well-developed, well-nourished female in no acute distress.  HENT:  Normocephalic, atraumatic, External right and left ear normal. Oropharynx is clear and moist EYES: Conjunctivae and EOM are normal. Pupils are equal, round, and reactive to light. No scleral icterus.  NECK: Normal range of motion, supple, no masses.  Normal thyroid .  SKIN: Skin is warm and dry. No rash noted. Not diaphoretic. No erythema. No pallor. MUSCULOSKELETAL: Normal range of motion. No tenderness.  No cyanosis, clubbing, or edema.  2+ distal pulses. NEUROLOGIC: Alert and oriented to person, place, and time. Normal reflexes, muscle tone coordination.  PSYCHIATRIC: Normal mood and affect. Normal behavior. Normal judgment and thought content. CARDIOVASCULAR: Normal heart rate noted, regular rhythm RESPIRATORY: Clear to auscultation bilaterally. Effort and breath sounds normal, no problems with respiration noted. BREASTS: Symmetric in size. No masses, tenderness, skin changes, nipple drainage, or lymphadenopathy bilaterally.  ABDOMEN: Soft, no distention noted.  No tenderness, rebound or guarding.  PELVIC: Normal appearing external genitalia and urethral meatus; normal appearing vaginal mucosa and cervix.  No abnormal discharge noted.  Pap smear not due until next year.  Normal uterine size, no other palpable masses, no uterine or adnexal tenderness.  .   Assessment and Plan:  Annual Well Women GYN exam   Pap: Will follow up results of pap smear and manage accordingly. Mammogram : ordered Labs: none Refills:none Referral;none Discussed HT benefits for bone, heart , and brain health. She will discuss with her partner and let me know if she would like to start for  protective measures.  Routine preventative health maintenance measures emphasized. Please refer to After Visit Summary for other counseling recommendations.      Zelda Hummer, CNM [facility]  [facility],  [facility]      [1]  Current Outpatient Medications on File Prior to Visit  Medication Sig Dispense Refill   Ascorbic Acid (VITAMIN C) 1000 MG tablet Take 1,000 mg by mouth daily.     Current Facility-Administered Medications on File Prior to Visit  Medication Dose Route Frequency Provider Last Rate Last Admin   betamethasone  acetate-betamethasone  sodium phosphate (CELESTONE ) injection 3 mg  3 mg Intramuscular Once Evans, Brent M, DPM       betamethasone  acetate-betamethasone  sodium phosphate (CELESTONE ) injection 3 mg  3 mg Intramuscular Once Janit Thresa HERO, DPM      [2] No Known Allergies  "

## 2024-07-02 LAB — CYTOLOGY - PAP
Adequacy: ABSENT
Comment: NEGATIVE
Comment: NEGATIVE
Comment: NEGATIVE
Diagnosis: UNDETERMINED — AB
HPV 16: NEGATIVE
HPV 18 / 45: NEGATIVE
High risk HPV: POSITIVE — AB

## 2024-07-04 ENCOUNTER — Encounter: Payer: Self-pay | Admitting: Certified Nurse Midwife

## 2024-07-04 ENCOUNTER — Ambulatory Visit: Payer: Self-pay | Admitting: Certified Nurse Midwife

## 2024-07-29 ENCOUNTER — Encounter

## 2025-04-14 ENCOUNTER — Encounter: Admitting: Nurse Practitioner
